# Patient Record
Sex: Male | Born: 1965 | Race: White | Hispanic: No | Marital: Married | Smoking: Never smoker
Health system: Southern US, Community
[De-identification: ages and names within clinical notes are randomized; demographics above are authoritative.]

## PROBLEM LIST (undated history)

## (undated) DIAGNOSIS — D239 Other benign neoplasm of skin, unspecified: Secondary | ICD-10-CM

## (undated) DIAGNOSIS — E785 Hyperlipidemia, unspecified: Secondary | ICD-10-CM

## (undated) DIAGNOSIS — M25511 Pain in right shoulder: Secondary | ICD-10-CM

## (undated) DIAGNOSIS — F419 Anxiety disorder, unspecified: Secondary | ICD-10-CM

## (undated) DIAGNOSIS — E669 Obesity, unspecified: Secondary | ICD-10-CM

## (undated) DIAGNOSIS — R400 Somnolence: Secondary | ICD-10-CM

## (undated) DIAGNOSIS — R1013 Epigastric pain: Secondary | ICD-10-CM

## (undated) DIAGNOSIS — E739 Lactose intolerance, unspecified: Secondary | ICD-10-CM

## (undated) DIAGNOSIS — R739 Hyperglycemia, unspecified: Secondary | ICD-10-CM

## (undated) DIAGNOSIS — K219 Gastro-esophageal reflux disease without esophagitis: Secondary | ICD-10-CM

## (undated) DIAGNOSIS — G4733 Obstructive sleep apnea (adult) (pediatric): Secondary | ICD-10-CM

## (undated) DIAGNOSIS — R0789 Other chest pain: Secondary | ICD-10-CM

## (undated) HISTORY — DX: Anxiety disorder, unspecified: F41.9

## (undated) HISTORY — DX: Pain in right shoulder: M25.511

## (undated) HISTORY — DX: Gastro-esophageal reflux disease without esophagitis: K21.9

## (undated) HISTORY — DX: Other chest pain: R07.89

## (undated) HISTORY — DX: Obstructive sleep apnea (adult) (pediatric): G47.33

## (undated) HISTORY — DX: Obesity, unspecified: E66.9

## (undated) HISTORY — DX: Other benign neoplasm of skin, unspecified: D23.9

## (undated) HISTORY — DX: Somnolence: R40.0

## (undated) HISTORY — DX: Hyperglycemia, unspecified: R73.9

## (undated) HISTORY — PX: TONSILLECTOMY AND ADENOIDECTOMY: SUR1326

## (undated) HISTORY — DX: Hyperlipidemia, unspecified: E78.5

## (undated) HISTORY — DX: Epigastric pain: R10.13

## (undated) HISTORY — DX: Lactose intolerance, unspecified: E73.9

## (undated) HISTORY — PX: COLONOSCOPY: SHX174

---

## 2009-11-09 ENCOUNTER — Encounter (INDEPENDENT_AMBULATORY_CARE_PROVIDER_SITE_OTHER): Payer: Self-pay | Admitting: *Deleted

## 2010-11-13 NOTE — Letter (Signed)
Summary: New Patient letter  [facility]  [address] [place], [place] [address]   Phone: [phone]  Fax: [phone]       11/09/2009 MRN: [id number]  BRIANA NEWMAN [address] [place], [place]  [address]  Dear Mr. Fiser,  Welcome to the Gastroenterology Division at [facility].    You are scheduled to see Dr.  Marina Goodell on 12-11-09 at 2:45pm on the 3rd floor at [facility], [address]  We ask that you try to arrive at our office 15 minutes prior to your appointment time to allow for check-in.  We would like you to complete the enclosed self-administered evaluation form prior to your visit and bring it with you on the day of your appointment.  We will review it with you.  Also, please bring a complete list of all your medications or, if you prefer, bring the medication bottles and we will list them.  Please bring your insurance card so that we may make a copy of it.  If your insurance requires a referral to see a specialist, please bring your referral form from your primary care physician.  Co-payments are due at the time of your visit and may be paid by cash, check or credit card.     Your office visit will consist of a consult with your physician (includes a physical exam), any laboratory testing he/she may order, scheduling of any necessary diagnostic testing (e.g. x-ray, ultrasound, CT-scan), and scheduling of a procedure (e.g. Endoscopy, Colonoscopy) if required.  Please allow enough time on your schedule to allow for any/all of these possibilities.    If you cannot keep your appointment, please call [phone] to cancel or reschedule prior to your appointment date.  This allows [place] the opportunity to schedule an appointment for another patient in need of care.  If you do not cancel or reschedule by 5 p.m. the business day prior to your appointment date, you will be charged a $50.00 late cancellation/no-show fee.    Thank you for choosing [facility] for your medical needs.  We appreciate the opportunity to care for you.  Please visit [place] at our website  to learn more about our practice.                     Sincerely,                                                             The Gastroenterology Division

## 2015-08-14 ENCOUNTER — Encounter: Payer: Self-pay | Admitting: Neurology

## 2015-08-14 ENCOUNTER — Ambulatory Visit (INDEPENDENT_AMBULATORY_CARE_PROVIDER_SITE_OTHER): Payer: BC Managed Care – PPO | Admitting: Neurology

## 2015-08-14 VITALS — BP 146/92 | HR 78 | Resp 16 | Ht 66.0 in | Wt 215.0 lb

## 2015-08-14 DIAGNOSIS — R351 Nocturia: Secondary | ICD-10-CM | POA: Diagnosis not present

## 2015-08-14 DIAGNOSIS — G4761 Periodic limb movement disorder: Secondary | ICD-10-CM | POA: Diagnosis not present

## 2015-08-14 DIAGNOSIS — R0683 Snoring: Secondary | ICD-10-CM | POA: Diagnosis not present

## 2015-08-14 DIAGNOSIS — G4719 Other hypersomnia: Secondary | ICD-10-CM

## 2015-08-14 DIAGNOSIS — G2581 Restless legs syndrome: Secondary | ICD-10-CM | POA: Diagnosis not present

## 2015-08-14 NOTE — Progress Notes (Signed)
Subjective:    Patient ID: Chase Drake is a 49 y.o. male.  HPI     [name], MD, PhD [facility] [address] [place], [place] [address]  Dear Dr. Virgina Jock,   I saw your patient, Chase Drake, upon your kind request in my neurologic clinic today for initial consultation of his sleep disorder, in particular concern for underlying obstructive sleep apnea. The patient is unaccompanied today. As you know, Chase Drake is a 49 year old right-handed gentleman with an underlying medical history of anxiety, hyperlipidemia, allergic rhinitis, atypical chest pain, Crohn's disease, eosinophilic esophagitis, restless leg syndrome, and obesity, who reports snoring and excessive daytime somnolence, problems going to sleep, and nonrestorative sleep. I reviewed your office note from 07/20/2015, which you kindly included. His Epworth sleepiness score is 13 out of 24 today, his fatigue score is 41 out of 63. He reports multiple nighttime awakenings. He has nocturia, on average 2-3 times per night. He works full-time as a Programmer, multimedia. His bedtime is around 9 PM. His rise time is around 5:30 AM. He does not wake up rested and usually feels groggy. He has occasional nightmares and vivid dreams. He has had vivid dreams for long time. He has been on Paxil for about 3 years. His weight has been stable. He is trying to lose weight. He does not drink alcohol or smoke. He drinks caffeine in the form of coffee, usually one per day. He has no TV in the bedroom. He has a dog in the bedroom at times but not in bed. He lives with his wife. He has 2 grown children. He has problems initiating and maintaining sleep at times. He does not take any medication for this. In the past, he took Mirapex for restless legs which was helpful. He no longer takes it. He ran out of the prescription a year ago or so. He has no family history of OSA. He had a tonsillectomy as a child. He  has restless leg symptoms, sometimes in the middle of the night he has difficulty going back to sleep in his legs hurt and sometimes he has a crawling sensation in his legs. His wife endorses that he twitches his legs and his sleep. His snoring can be loud. He has had pauses in his breathing per wife's report. He denies morning headaches.  His Past Medical History Is Significant For: Past Medical History  Diagnosis Date  . Anxiety   . Hyperlipidemia   . Obesity   . Dysplastic nevus   . Dyspepsia   . Atypical chest pain   . Somnolence   . OSA (obstructive sleep apnea)   . Crohn's disease ([place])   . Lactose intolerance   . Right shoulder pain   . Hyperglycemia   . GERD (gastroesophageal reflux disease)     His Past Surgical History Is Significant For: Past Surgical History  Procedure Laterality Date  . Tonsillectomy and adenoidectomy      His Family History Is Significant For: Family History  Problem Relation Age of Onset  . Breast cancer Mother   . Benign prostatic hyperplasia Father   . Anxiety disorder Sister   . Asthma Brother     His Social History Is Significant For: Social History   Social History  . Marital Status: Married    Spouse Name: N/A  . Number of Children: 2  . Years of Education: BA   Occupational History  . [employer]  Social History Main Topics  . Smoking status: Never Smoker   . Smokeless tobacco: None  . Alcohol Use: 0.0 oz/week    0 Standard drinks or equivalent per week  . Drug Use: No  . Sexual Activity: Not Asked   Other Topics Concern  . None   Social History Narrative   Drinks 1 cup of coffee a day     His Allergies Are:  Allergies  Allergen Reactions  . Fish Allergy   . Poultry Meal   :   His Current Medications Are:  Outpatient Encounter Prescriptions as of 08/14/2015  Medication Sig  . omeprazole (PRILOSEC) 20 MG capsule Take 20 mg by mouth daily.  Marland Kitchen PARoxetine (PAXIL) 30 MG tablet Take 30 mg by mouth  daily.   No facility-administered encounter medications on file as of 08/14/2015.  :  Review of Systems:  Out of a complete 14 point review of systems, all are reviewed and negative with the exception of these symptoms as listed below:   Review of Systems  Constitutional: Positive for fatigue.  Respiratory: Positive for cough.   Musculoskeletal:       Aching muscles   Allergic/Immunologic: Positive for food allergies.  Neurological: Positive for headaches.       Trouble falling and staying asleep, snoring, restless legs, witnessed apnea, wakes up feeling tired in the morning, daytime tiredness, denies taking naps   Psychiatric/Behavioral:       Anxiety, decreased energy   Epworth Sleepiness Scale 0= would never doze 1= slight chance of dozing 2= moderate chance of dozing 3= high chance of dozing  Sitting and reading:1 Watching TV:2 Sitting inactive in a public place (ex. Theater or meeting):1 As a passenger in a car for an hour without a break:2 Lying down to rest in the afternoon:3 Sitting and talking to someone:1 Sitting quietly after lunch (no alcohol):2 In a car, while stopped in traffic:1 Total:13  Objective:  Neurologic Exam  Physical Exam Physical Examination:   Filed Vitals:   08/14/15 1559  BP: 146/92  Pulse: 78  Resp: 16    General Examination: The patient is a very pleasant 49 y.o. male in no acute distress. He appears well-developed and well-nourished and well groomed.   HEENT: Normocephalic, atraumatic, pupils are equal, round and reactive to light and accommodation. Funduscopic exam is normal with sharp disc margins noted. Extraocular tracking is good without limitation to gaze excursion or nystagmus noted. Normal smooth pursuit is noted. Hearing is grossly intact. Tympanic membranes are clear bilaterally. Face is symmetric with normal facial animation and normal facial sensation. Speech is clear with no dysarthria noted. There is no hypophonia. There is  no lip, neck/head, jaw or voice tremor. Neck is supple with full range of passive and active motion. There are no carotid bruits on auscultation. Oropharynx exam reveals: mild mouth dryness, adequate dental hygiene and moderate airway crowding, due to  redundant soft palate and elongated uvula. Tongue is thicker. Mallampati is class II. Tonsils are absent. Neck circumference is 17-7/8 inches. He has a Mild overbite. Nasal inspection reveals no significant nasal mucosal bogginess or redness and no septal deviation.   Chest: Clear to auscultation without wheezing, rhonchi or crackles noted.  Heart: S1+S2+0, regular and normal without murmurs, rubs or gallops noted.   Abdomen: Soft, non-tender and non-distended with normal bowel sounds appreciated on auscultation.  Extremities: There is no pitting edema in the distal lower extremities bilaterally. Pedal pulses are intact.  Skin: Warm and dry without trophic  changes noted. There are no varicose veins.  Musculoskeletal: exam reveals no obvious joint deformities, tenderness or joint swelling or erythema.   Neurologically:  Mental status: The patient is awake, alert and oriented in all 4 spheres. His immediate and remote memory, attention, language skills and fund of knowledge are appropriate. There is no evidence of aphasia, agnosia, apraxia or anomia. Speech is clear with normal prosody and enunciation. Thought process is linear. Mood is normal and affect is normal.  Cranial nerves II - XII are as described above under HEENT exam. In addition: shoulder shrug is normal with equal shoulder height noted. Motor exam: Normal bulk, strength and tone is noted. There is no drift, tremor or rebound. Romberg is negative. Reflexes are 2+ throughout. Babinski: Toes are flexor bilaterally. Fine motor skills and coordination: intact with normal finger taps, normal hand movements, normal rapid alternating patting, normal foot taps and normal foot agility.  Cerebellar  testing: No dysmetria or intention tremor on finger to nose testing. Heel to shin is unremarkable bilaterally. There is no truncal or gait ataxia.  Sensory exam: intact to light touch, pinprick, vibration, temperature sense in the upper and lower extremities.  Gait, station and balance: He stands easily. No veering to one side is noted. No leaning to one side is noted. Posture is age-appropriate and stance is narrow based. Gait shows normal stride length and normal pace. No problems turning are noted. He turns en bloc. Tandem walk is unremarkable.   Assessment and Plan:  In summary, Chase Drake is a very pleasant 49 y.o.-year old male with an underlying medical history of anxiety, hyperlipidemia, allergic rhinitis, atypical chest pain, Crohn's disease, eosinophilic esophagitis, restless leg syndrome, and obesity, who reports snoring and excessive daytime somnolence, problems going to sleep, and nonrestorative sleep, nocturia and multiple nighttime awakenings. His history and physical exam are indeed concerning for obstructive sleep apnea (OSA). in addition, he complains of restless leg symptoms and leg twitching in his sleep, in keeping with PLMD. I had a long chat with the patient about my findings and the diagnosis of OSA, its prognosis and treatment options. We talked about medical treatments, surgical interventions and non-pharmacological approaches. I explained in particular the risks and ramifications of untreated moderate to severe OSA, especially with respect to developing cardiovascular disease down the Road, including congestive heart failure, difficult to treat hypertension, cardiac arrhythmias, or stroke. Even type 2 diabetes has, in part, been linked to untreated OSA. Symptoms of untreated OSA include daytime sleepiness, memory problems, mood irritability and mood disorder such as depression and anxiety, lack of energy, as well as recurrent headaches, especially morning headaches. We talked  about trying to maintain a healthy lifestyle in general, as well as the importance of weight control. I encouraged the patient to eat healthy, exercise daily and keep well hydrated, to keep a scheduled bedtime and wake time routine, to not skip any meals and eat healthy snacks in between meals. I advised the patient not to drive when feeling sleepy. I recommended the following at this time: sleep study with potential positive airway pressure titration. (We will score hypopneas at 3% and split the sleep study into diagnostic and treatment portion, if the estimated. 2 hour AHI is >15/h).   I explained the sleep test procedure to the patient and also outlined possible surgical and non-surgical treatment options of OSA, including the use of a custom-made dental device (which would require a referral to a specialist dentist or oral surgeon), upper airway surgical  options, such as pillar implants, radiofrequency surgery, tongue base surgery, and UPPP (which would involve a referral to an ENT surgeon). Rarely, jaw surgery such as mandibular advancement may be considered.  I also explained the CPAP treatment option to the patient, who indicated that he would be willing to try CPAP if the need arises. I explained the importance of being compliant with PAP treatment, not only for insurance purposes but primarily to improve His symptoms, and for the patient's long term health benefit, including to reduce His cardiovascular risks. I answered all his questions today and the patient was in agreement. I would like to see him back after the sleep study is completed and encouraged him to call with any interim questions, concerns, problems or updates.   Thank you very much for allowing me to participate in the care of this nice patient. If I can be of any further assistance to you please do not hesitate to call me at [phone].  Sincerely,   [name], MD, PhD

## 2015-08-14 NOTE — Patient Instructions (Addendum)
Based on your symptoms and your exam I believe you are at risk for obstructive sleep apnea or OSA, and I think we should proceed with a sleep study to determine whether you do or do not have OSA and how severe it is. If you have more than mild OSA, I want you to consider treatment with CPAP. Please remember, the risks and ramifications of moderate to severe obstructive sleep apnea or OSA are: Cardiovascular disease, including congestive heart failure, stroke, difficult to control hypertension, arrhythmias, and even type 2 diabetes has been linked to untreated OSA. Sleep apnea causes disruption of sleep and sleep deprivation in most cases, which, in turn, can cause recurrent headaches, problems with memory, mood, concentration, focus, and vigilance. Most people with untreated sleep apnea report excessive daytime sleepiness, which can affect their ability to drive. Please do not drive if you feel sleepy.   I will likely see you back after your sleep study to go over the test results and where to go from there. We will call you after your sleep study to advise about the results (most likely, you will hear from Beverlee Nims, my nurse) and to set up an appointment at the time, as necessary.    Our sleep lab administrative assistant, Arrie Aran will meet with you or call you to schedule your sleep study. If you don't hear back from her by next week please feel free to call her at [phone]. This is her direct line and please leave a message with your phone number to call back if you get the voicemail box. She will call back as soon as possible.   We will be on the lookout for leg twitching in your sleep as well and we will be in touch over the phone after the study is done.

## 2015-09-04 ENCOUNTER — Ambulatory Visit (INDEPENDENT_AMBULATORY_CARE_PROVIDER_SITE_OTHER): Payer: BC Managed Care – PPO | Admitting: Neurology

## 2015-09-04 DIAGNOSIS — G4731 Primary central sleep apnea: Secondary | ICD-10-CM

## 2015-09-04 DIAGNOSIS — G4733 Obstructive sleep apnea (adult) (pediatric): Secondary | ICD-10-CM | POA: Diagnosis not present

## 2015-09-04 DIAGNOSIS — G479 Sleep disorder, unspecified: Secondary | ICD-10-CM

## 2015-09-04 DIAGNOSIS — G4761 Periodic limb movement disorder: Secondary | ICD-10-CM

## 2015-09-04 NOTE — Sleep Study (Signed)
Please see the scanned sleep study interpretation located in the Procedure tab within the Chart Review section. 

## 2015-09-05 ENCOUNTER — Telehealth: Payer: Self-pay | Admitting: Neurology

## 2015-09-05 DIAGNOSIS — G4733 Obstructive sleep apnea (adult) (pediatric): Secondary | ICD-10-CM

## 2015-09-05 NOTE — Telephone Encounter (Signed)
I spoke to patient and he is aware of results and recommendations. He would like to proceed with treatment. I will refer to [facility]. Pt will call back to make f/u appt. I will send him a letter reminding him to make appt and stress importance with compliance. I will send report to PCP>

## 2015-09-05 NOTE — Telephone Encounter (Signed)
Diana:  Patient referred by Dr. Virgina Jock, seen by me on 08/14/15, split study on 09/04/15, Ins: [id number]. Please call and notify patient that the recent sleep study confirmed the diagnosis of severe OSA. He did well with CPAP during the study with significant improvement of the respiratory events. Therefore, I would like start the patient on CPAP therapy at home by prescribing a machine for home use. I placed the order in the chart. The patient will need a follow up appointment with me in 8 to 10 weeks post set up that has to be scheduled; please go ahead and schedule while you have the patient on the phone and make sure patient understands the importance of keeping this window for the FU appointment, as it is often an insurance requirement and failing to adhere to this may result in losing coverage for sleep apnea treatment.  Please re-enforce the importance of compliance with treatment and the need for [place] to monitor compliance data - again an insurance requirement and good feedback for the patient as far as how they are doing.  Also remind patient, that any upcoming CPAP machine or mask issues, should be first addressed with the DME company. Please ask if patient has a preference regarding DME company.  Please arrange for CPAP set up at home through a DME company of patient's choice - once you have spoken to the patient - and faxed/routed report to PCP and referring MD (if other than PCP), you can close this encounter, thanks,   [name], MD, PhD [facility] ([place])

## 2015-11-02 ENCOUNTER — Telehealth: Payer: Self-pay

## 2015-11-02 NOTE — Telephone Encounter (Signed)
I spoke to Chase Drake and reminded him to bring CPAP or memory card from CPAP machine to appt. Pt also needed to reschedule for the 31st.

## 2015-11-06 ENCOUNTER — Ambulatory Visit: Payer: BC Managed Care – PPO | Admitting: Neurology

## 2015-11-14 ENCOUNTER — Ambulatory Visit: Payer: BC Managed Care – PPO | Admitting: Neurology

## 2015-11-14 ENCOUNTER — Telehealth: Payer: Self-pay

## 2015-11-14 NOTE — Telephone Encounter (Signed)
Patient did not show to appt.  

## 2015-12-05 ENCOUNTER — Ambulatory Visit (INDEPENDENT_AMBULATORY_CARE_PROVIDER_SITE_OTHER): Payer: BC Managed Care – PPO | Admitting: Neurology

## 2015-12-05 ENCOUNTER — Encounter: Payer: Self-pay | Admitting: Neurology

## 2015-12-05 VITALS — BP 158/88 | HR 78 | Resp 18 | Ht 66.0 in | Wt 220.0 lb

## 2015-12-05 DIAGNOSIS — G4733 Obstructive sleep apnea (adult) (pediatric): Secondary | ICD-10-CM | POA: Diagnosis not present

## 2015-12-05 DIAGNOSIS — E669 Obesity, unspecified: Secondary | ICD-10-CM | POA: Diagnosis not present

## 2015-12-05 DIAGNOSIS — Z9989 Dependence on other enabling machines and devices: Principal | ICD-10-CM

## 2015-12-05 NOTE — Progress Notes (Signed)
Subjective:    Patient ID: Chase Drake is a 50 y.o. male.  HPI     Interim history:   Chase Drake is a 50 year old right-handed gentleman with an underlying medical history of anxiety, hyperlipidemia, allergic rhinitis, atypical chest pain, Crohn's disease, eosinophilic esophagitis, restless leg syndrome, and obesity, who presents for follow-up consultation of his obstructive sleep apnea, after his recent sleep study. Of note, the patient no showed for an appointment on 11/14/2015. The patient is unaccompanied today. I first met him on 08/14/2015 at the request of his primary care physician, at which time the patient reported snoring, nonrestorative sleep, difficulty initiating sleep and excessive daytime somnolence. I invited him back for sleep study. He had a split-night sleep study on 09/04/2015. I went over his test results with him in detail today. Baseline sleep efficiency was 70.6% with a sleep latency of 11 minutes and wake after sleep onset of 48.5 minutes with severe sleep fragmentation noted. He had an elevated arousal index. He had absence of slow-wave sleep and REM sleep was 19.6% with a mildly prolonged REM latency. He had no significant EKG or EEG changes. He had moderate PLMS with an index of 39 per hour, resulting in 6.3 arousals per hour. Moderate to loud snoring was noted, AHI was 55.8 per hour. Average oxygen saturation was 94%, nadir was 68% in REM sleep. He was then titrated on CPAP. Sleep efficiency was 84.5%, latency to sleep 3.5 minutes and wake after sleep onset was 40 minutes with mild to moderate sleep fragmentation noted. The arousal index was normal. He had absence of slow-wave sleep and REM sleep was increased at 34.2%. Average oxygen saturation was 96%, nadir was 89%. He had no significant PLMS or EKG or EEG changes during the second part of the study. CPAP was titrated from 5 cm to 14 cm. AHI was 0 per hour on the final pressure with supine REM sleep achieved. Based on  his test results are prescribed CPAP therapy for home use of a pressure of 14 cm he had nasal pillows.  Today, 12/05/2015: I reviewed his CPAP compliance data from 11/05/2015 through 12/04/2015 which is a total of 30 days during which time he used his machine every night with percent used days greater than 4 hours at 93.3%, indicating excellent compliance with an average usage of 6 hours and 51 minutes, residual AHI low at 0.9 per hour, leaked low, pressure at 14 cm.  Today, 10/03/2016: He reports doing well. He feels like he is resting better. He is improved in his daytime somnolence and sleep quality. He had a viral cold recently including symptoms of bronchitis and did not use his machine for about 2 days. He still have some residual nasal congestion and has tried [employer] with some success. He denies any fever or headache. His weight increased by 5 pounds in the last 3 months after the holidays. He stopped taking his Paxil a few months ago. He did not feel it was helpful.  Previously:  08/14/2015: He reports snoring and excessive daytime somnolence, problems going to sleep, and nonrestorative sleep. I reviewed your office note from 07/20/2015, which you kindly included. His Epworth sleepiness score is 13 out of 24 today, his fatigue score is 41 out of 63. He reports multiple nighttime awakenings. He has nocturia, on average 2-3 times per night. He works full-time as a Programmer, multimedia. His bedtime is around 9 PM. His rise time is around 5:30 AM. He does not  wake up rested and usually feels groggy. He has occasional nightmares and vivid dreams. He has had vivid dreams for long time. He has been on Paxil for about 3 years. His weight has been stable. He is trying to lose weight. He does not drink alcohol or smoke. He drinks caffeine in the form of coffee, usually one per day. He has no TV in the bedroom. He has a dog in the bedroom at times but not in bed. He lives with his wife. He has 2 grown  children. He has problems initiating and maintaining sleep at times. He does not take any medication for this. In the past, he took Mirapex for restless legs which was helpful. He no longer takes it. He ran out of the prescription a year ago or so. He has no family history of OSA. He had a tonsillectomy as a child. He has restless leg symptoms, sometimes in the middle of the night he has difficulty going back to sleep in his legs hurt and sometimes he has a crawling sensation in his legs. His wife endorses that he twitches his legs and his sleep. His snoring can be loud. He has had pauses in his breathing per wife's report. He denies morning headaches.  His Past Medical History Is Significant For: Past Medical History  Diagnosis Date  . Anxiety   . Hyperlipidemia   . Obesity   . Dysplastic nevus   . Dyspepsia   . Atypical chest pain   . Somnolence   . OSA (obstructive sleep apnea)   . Crohn's disease ([place])   . Lactose intolerance   . Right shoulder pain   . Hyperglycemia   . GERD (gastroesophageal reflux disease)     His Past Surgical History Is Significant For: Past Surgical History  Procedure Laterality Date  . Tonsillectomy and adenoidectomy      His Family History Is Significant For: Family History  Problem Relation Age of Onset  . Breast cancer Mother   . Benign prostatic hyperplasia Father   . Anxiety disorder Sister   . Asthma Brother     His Social History Is Significant For: Social History   Social History  . Marital Status: Married    Spouse Name: N/A  . Number of Children: 2  . Years of Education: BA   Occupational History  . [employer] History Main Topics  . Smoking status: Never Smoker   . Smokeless tobacco: None  . Alcohol Use: 0.0 oz/week    0 Standard drinks or equivalent per week  . Drug Use: No  . Sexual Activity: Not Asked   Other Topics Concern  . None   Social History Narrative   Drinks 1 cup of coffee a day     His  Allergies Are:  Allergies  Allergen Reactions  . Fish Allergy   . Poultry Meal   :   His Current Medications Are:  Outpatient Encounter Prescriptions as of 12/05/2015  Medication Sig  . omeprazole (PRILOSEC) 20 MG capsule Take 20 mg by mouth daily.  Marland Kitchen PARoxetine (PAXIL) 30 MG tablet Take 30 mg by mouth daily.   No facility-administered encounter medications on file as of 12/05/2015.  :  Review of Systems:  Out of a complete 14 point review of systems, all are reviewed and negative with the exception of these symptoms as listed below:   Review of Systems  Neurological:       Patient feels like he is  doing well on CPAP. Has not used for a couple days recently due to respiratory virus.     Objective:  Neurologic Exam  Physical Exam Physical Examination:   Filed Vitals:   12/05/15 1511  BP: 158/88  Pulse: 78  Resp: 18   General Examination: The patient is a very pleasant 50 y.o. male in no acute distress. He appears well-developed and well-nourished and well groomed. He is in good spirits today.  HEENT: Normocephalic, atraumatic, pupils are equal, round and reactive to light and accommodation. Extraocular tracking is good without limitation to gaze excursion or nystagmus noted. Normal smooth pursuit is noted. Hearing is grossly intact. Face is symmetric with normal facial animation and normal facial sensation. Speech is clear with no dysarthria noted. There is no hypophonia. There is no lip, neck/head, jaw or voice tremor. Neck is supple with full range of passive and active motion. There are no carotid bruits on auscultation. Oropharynx exam reveals: mild mouth dryness, adequate dental hygiene and moderate airway crowding, due to  redundant soft palate and elongated uvula. Tongue is thicker. Mallampati is class II. Tonsils are absent. He has a Mild overbite. Nasal inspection reveals no significant nasal mucosal bogginess or redness and no septal deviation.   Chest: Clear to  auscultation without wheezing, rhonchi or crackles noted.  Heart: S1+S2+0, regular and normal without murmurs, rubs or gallops noted.   Abdomen: Soft, non-tender and non-distended with normal bowel sounds appreciated on auscultation.  Extremities: There is no pitting edema in the distal lower extremities bilaterally. Pedal pulses are intact.  Skin: Warm and dry without trophic changes noted. There are no varicose veins.  Musculoskeletal: exam reveals no obvious joint deformities, tenderness or joint swelling or erythema.   Neurologically:  Mental status: The patient is awake, alert and oriented in all 4 spheres. His immediate and remote memory, attention, language skills and fund of knowledge are appropriate. There is no evidence of aphasia, agnosia, apraxia or anomia. Speech is clear with normal prosody and enunciation. Thought process is linear. Mood is normal and affect is normal.  Cranial nerves II - XII are as described above under HEENT exam. In addition: shoulder shrug is normal with equal shoulder height noted. Motor exam: Normal bulk, strength and tone is noted. There is no drift, tremor or rebound. Romberg is negative. Reflexes are 2+ throughout. Fine motor skills and coordination: intact with normal finger taps, normal hand movements, normal rapid alternating patting, normal foot taps and normal foot agility.  Cerebellar testing: No dysmetria or intention tremor on finger to nose testing. Heel to shin is unremarkable bilaterally. There is no truncal or gait ataxia.  Sensory exam: intact to light touch in the upper and lower extremities.  Gait, station and balance: He stands easily. No veering to one side is noted. No leaning to one side is noted. Posture is age-appropriate and stance is narrow based. Gait shows normal stride length and normal pace. No problems turning are noted. He turns en bloc. Tandem walk is unremarkable.   Assessment and Plan:  In summary, Chase Drake is a  very pleasant 50 year old male with an underlying medical history of anxiety, hyperlipidemia, allergic rhinitis, atypical chest pain, Crohn's disease, eosinophilic esophagitis, restless leg syndrome, and obesity, who presents for follow-up consultation of his obstructive sleep apnea. He had a split-night sleep study in November 2016 and we talked about the test results in detail today. He has severe obstructive sleep apnea, well treated with CPAP at 14 cm  with excellent compliance and good results reported. He is commended on his treatment adherence and I'm pleased to hear that he feels better. He is advised to monitor his blood pressure. He is advised to use CPAP regularly and try to use nasal saline rinses to combat congestion. He is advised to stay well-hydrated, should he start having a headache or fevers, he will have to see his primary care physician for concern for sinusitis. His physical exam and neurological exam are stable. We talked again about the risks and ramifications of untreated severe OSA including cardiovascular disease, difficult to treat hypertension, cardiac arrhythmias, or stroke. Even type 2 diabetes has, in part, been linked to untreated OSA. Symptoms of untreated OSA include daytime sleepiness, memory problems, mood irritability and mood disorder such as depression and anxiety, lack of energy, as well as recurrent headaches, especially morning headaches. We talked about trying to maintain a healthy lifestyle in general, as well as the importance of weight control. I encouraged the patient to eat healthy, exercise daily and keep well hydrated, to keep a scheduled bedtime and wake time routine, to not skip any meals and eat healthy snacks in between meals. I advised the patient not to drive when feeling sleepy. I explained the importance of being compliant with PAP treatment, not only for insurance purposes but primarily to improve His symptoms, and for the patient's long term health  benefit, including to reduce His cardiovascular risks. I answered all his questions today and the patient was in agreement. I would like to see him back after the sleep study is completed and encouraged him to call with any interim questions, concerns, problems or updates.

## 2015-12-05 NOTE — Patient Instructions (Addendum)

## 2016-04-15 ENCOUNTER — Other Ambulatory Visit: Payer: Self-pay | Admitting: Orthopedic Surgery

## 2016-04-15 NOTE — H&P (Signed)
     PREOPERATIVE H&P  Chief Complaint: right arm pain, right arm weakness  HPI: Chase Drake is a 50 y.o. male who presents with ongoing pain and weakness in the right arm.  The patient has had symptoms now for approximately 2 months.  MRI reveals right-sided neuroforaminal stenosis at C5-C6 and C6-C7, correlating to the patient's symptoms  Patient has failed multiple forms of conservative care and continues to have pain (see office notes for additional details regarding the patient's full course of treatment)  Past Medical History  Diagnosis Date  . Anxiety   . Hyperlipidemia   . Obesity   . Dysplastic nevus   . Dyspepsia   . Atypical chest pain   . Somnolence   . OSA (obstructive sleep apnea)   . Crohn's disease ([place])   . Lactose intolerance   . Right shoulder pain   . Hyperglycemia   . GERD (gastroesophageal reflux disease)    Past Surgical History  Procedure Laterality Date  . Tonsillectomy and adenoidectomy     Social History   Social History  . Marital Status: Married    Spouse Name: N/A  . Number of Children: 2  . Years of Education: BA   Occupational History  . [place] History Main Topics  . Smoking status: Never Smoker   . Smokeless tobacco: Not on file  . Alcohol Use: 0.0 oz/week    0 Standard drinks or equivalent per week  . Drug Use: No  . Sexual Activity: Not on file   Other Topics Concern  . Not on file   Social History Narrative   Drinks 1 cup of coffee a day    Family History  Problem Relation Age of Onset  . Breast cancer Mother   . Benign prostatic hyperplasia Father   . Anxiety disorder Sister   . Asthma Brother    Allergies  Allergen Reactions  . Fish Allergy   . Poultry Meal    Prior to Admission medications   Medication Sig Start Date End Date Taking? Authorizing Provider  omeprazole (PRILOSEC) 20 MG capsule Take 20 mg by mouth daily.    Historical Provider, MD  PARoxetine (PAXIL) 30 MG tablet  Take 30 mg by mouth daily.    Historical Provider, MD     All other systems have been reviewed and were otherwise negative with the exception of those mentioned in the HPI and as above.  Physical Exam: There were no vitals filed for this visit.  General: Alert, no acute distress Cardiovascular: No pedal edema Respiratory: No cyanosis, no use of accessory musculature Skin: No lesions in the area of chief complaint Neurologic: Sensation intact distally Psychiatric: Patient is competent for consent with normal mood and affect Lymphatic: No axillary or cervical lymphadenopathy  MUSCULOSKELETAL: trace weakness is noted to right hand intrinsics as well as right finger flexion and triceps on the right  Assessment/Plan: Right arm pain  Plan for Procedure(s): ANTERIOR CERVICAL DECOMPRESSION FUSION CERVICAL 5-6, CERVICAL 6-7 WITH INSTRUMENTATION AND ALLOGRAFT   Sinclair Ship, MD 04/15/2016 2:55 PM

## 2016-04-17 ENCOUNTER — Encounter (HOSPITAL_COMMUNITY): Payer: Self-pay | Admitting: *Deleted

## 2016-04-17 MED ORDER — CEFAZOLIN SODIUM-DEXTROSE 2-4 GM/100ML-% IV SOLN
2.0000 g | INTRAVENOUS | Status: AC
  Administered 2016-04-18: 2 g via INTRAVENOUS
  Filled 2016-04-17: qty 100

## 2016-04-17 NOTE — Progress Notes (Signed)
Pt denies cardiac history, chest pain or sob. 

## 2016-04-18 ENCOUNTER — Ambulatory Visit (HOSPITAL_COMMUNITY): Payer: BC Managed Care – PPO

## 2016-04-18 ENCOUNTER — Encounter (HOSPITAL_COMMUNITY)
Admission: RE | Disposition: A | Discharge status: Home / Self Care | Payer: Self-pay | Source: Ambulatory Visit | Attending: Orthopedic Surgery

## 2016-04-18 ENCOUNTER — Ambulatory Visit (HOSPITAL_COMMUNITY): Payer: BC Managed Care – PPO | Admitting: Anesthesiology

## 2016-04-18 ENCOUNTER — Observation Stay (HOSPITAL_COMMUNITY)
Admission: RE | Admit: 2016-04-18 | Discharge: 2016-04-19 | Disposition: A | Discharge status: Home / Self Care | Payer: BC Managed Care – PPO | Source: Ambulatory Visit | Attending: Orthopedic Surgery | Admitting: Orthopedic Surgery

## 2016-04-18 DIAGNOSIS — M4802 Spinal stenosis, cervical region: Secondary | ICD-10-CM | POA: Diagnosis not present

## 2016-04-18 DIAGNOSIS — Z419 Encounter for procedure for purposes other than remedying health state, unspecified: Secondary | ICD-10-CM

## 2016-04-18 DIAGNOSIS — M50122 Cervical disc disorder at C5-C6 level with radiculopathy: Secondary | ICD-10-CM | POA: Diagnosis not present

## 2016-04-18 DIAGNOSIS — K5090 Crohn's disease, unspecified, without complications: Secondary | ICD-10-CM | POA: Diagnosis not present

## 2016-04-18 DIAGNOSIS — E739 Lactose intolerance, unspecified: Secondary | ICD-10-CM | POA: Insufficient documentation

## 2016-04-18 DIAGNOSIS — K219 Gastro-esophageal reflux disease without esophagitis: Secondary | ICD-10-CM | POA: Diagnosis not present

## 2016-04-18 DIAGNOSIS — Z6837 Body mass index (BMI) 37.0-37.9, adult: Secondary | ICD-10-CM | POA: Insufficient documentation

## 2016-04-18 DIAGNOSIS — Z91018 Allergy to other foods: Secondary | ICD-10-CM | POA: Insufficient documentation

## 2016-04-18 DIAGNOSIS — F419 Anxiety disorder, unspecified: Secondary | ICD-10-CM | POA: Insufficient documentation

## 2016-04-18 DIAGNOSIS — Z01818 Encounter for other preprocedural examination: Secondary | ICD-10-CM

## 2016-04-18 DIAGNOSIS — M50123 Cervical disc disorder at C6-C7 level with radiculopathy: Secondary | ICD-10-CM | POA: Insufficient documentation

## 2016-04-18 DIAGNOSIS — E785 Hyperlipidemia, unspecified: Secondary | ICD-10-CM | POA: Diagnosis not present

## 2016-04-18 DIAGNOSIS — Z91013 Allergy to seafood: Secondary | ICD-10-CM | POA: Diagnosis not present

## 2016-04-18 DIAGNOSIS — M5410 Radiculopathy, site unspecified: Secondary | ICD-10-CM | POA: Diagnosis present

## 2016-04-18 DIAGNOSIS — G4733 Obstructive sleep apnea (adult) (pediatric): Secondary | ICD-10-CM | POA: Insufficient documentation

## 2016-04-18 HISTORY — PX: ANTERIOR CERVICAL DECOMP/DISCECTOMY FUSION: SHX1161

## 2016-04-18 LAB — CBC WITH DIFFERENTIAL/PLATELET
Basophils Absolute: 0 10*3/uL (ref 0.0–0.1)
Basophils Relative: 1 %
EOS ABS: 0.3 10*3/uL (ref 0.0–0.7)
EOS PCT: 5 %
HCT: 45.3 % (ref 39.0–52.0)
Hemoglobin: 15.4 g/dL (ref 13.0–17.0)
LYMPHS ABS: 1.6 10*3/uL (ref 0.7–4.0)
LYMPHS PCT: 28 %
MCH: 30.9 pg (ref 26.0–34.0)
MCHC: 34 g/dL (ref 30.0–36.0)
MCV: 91 fL (ref 78.0–100.0)
MONO ABS: 0.4 10*3/uL (ref 0.1–1.0)
Monocytes Relative: 7 %
Neutro Abs: 3.5 10*3/uL (ref 1.7–7.7)
Neutrophils Relative %: 59 %
PLATELETS: 218 10*3/uL (ref 150–400)
RBC: 4.98 MIL/uL (ref 4.22–5.81)
RDW: 13.5 % (ref 11.5–15.5)
WBC: 5.8 10*3/uL (ref 4.0–10.5)

## 2016-04-18 LAB — COMPREHENSIVE METABOLIC PANEL
ALT: 53 U/L (ref 17–63)
ANION GAP: 7 (ref 5–15)
AST: 33 U/L (ref 15–41)
Albumin: 3.6 g/dL (ref 3.5–5.0)
Alkaline Phosphatase: 61 U/L (ref 38–126)
BUN: 14 mg/dL (ref 6–20)
CHLORIDE: 109 mmol/L (ref 101–111)
CO2: 24 mmol/L (ref 22–32)
Calcium: 9.3 mg/dL (ref 8.9–10.3)
Creatinine, Ser: 0.86 mg/dL (ref 0.61–1.24)
Glucose, Bld: 108 mg/dL — ABNORMAL HIGH (ref 65–99)
POTASSIUM: 4.5 mmol/L (ref 3.5–5.1)
SODIUM: 140 mmol/L (ref 135–145)
Total Bilirubin: 0.8 mg/dL (ref 0.3–1.2)
Total Protein: 6.1 g/dL — ABNORMAL LOW (ref 6.5–8.1)

## 2016-04-18 LAB — URINALYSIS, ROUTINE W REFLEX MICROSCOPIC
Bilirubin Urine: NEGATIVE
GLUCOSE, UA: NEGATIVE mg/dL
Hgb urine dipstick: NEGATIVE
KETONES UR: NEGATIVE mg/dL
LEUKOCYTES UA: NEGATIVE
NITRITE: NEGATIVE
PROTEIN: NEGATIVE mg/dL
Specific Gravity, Urine: 1.022 (ref 1.005–1.030)
pH: 5.5 (ref 5.0–8.0)

## 2016-04-18 LAB — PROTIME-INR
INR: 1.12 (ref 0.00–1.49)
PROTHROMBIN TIME: 14.6 s (ref 11.6–15.2)

## 2016-04-18 LAB — APTT: APTT: 29 s (ref 24–37)

## 2016-04-18 SURGERY — ANTERIOR CERVICAL DECOMPRESSION/DISCECTOMY FUSION 2 LEVELS
Anesthesia: General | Site: Spine Cervical

## 2016-04-18 MED ORDER — BUPIVACAINE-EPINEPHRINE (PF) 0.25% -1:200000 IJ SOLN
INTRAMUSCULAR | Status: AC
  Filled 2016-04-18: qty 30

## 2016-04-18 MED ORDER — FENTANYL CITRATE (PF) 100 MCG/2ML IJ SOLN
INTRAMUSCULAR | Status: DC | PRN
  Administered 2016-04-18: 50 ug via INTRAVENOUS
  Administered 2016-04-18: 100 ug via INTRAVENOUS
  Administered 2016-04-18 (×3): 50 ug via INTRAVENOUS

## 2016-04-18 MED ORDER — HYDROMORPHONE HCL 1 MG/ML IJ SOLN
INTRAMUSCULAR | Status: AC
  Filled 2016-04-18: qty 1

## 2016-04-18 MED ORDER — LABETALOL HCL 5 MG/ML IV SOLN
5.0000 mg | INTRAVENOUS | Status: AC | PRN
  Administered 2016-04-18 (×3): 5 mg via INTRAVENOUS

## 2016-04-18 MED ORDER — SENNOSIDES-DOCUSATE SODIUM 8.6-50 MG PO TABS: 1.0000 | ORAL_TABLET | Freq: Every evening | ORAL | Status: DC | PRN

## 2016-04-18 MED ORDER — HYDRALAZINE HCL 20 MG/ML IJ SOLN
5.0000 mg | INTRAMUSCULAR | Status: AC
  Administered 2016-04-18 (×2): 5 mg via INTRAVENOUS

## 2016-04-18 MED ORDER — BISACODYL 5 MG PO TBEC: 5.0000 mg | DELAYED_RELEASE_TABLET | Freq: Every day | ORAL | Status: DC | PRN

## 2016-04-18 MED ORDER — MINERAL OIL LIGHT 100 % EX OIL
TOPICAL_OIL | CUTANEOUS | Status: AC
  Filled 2016-04-18: qty 25

## 2016-04-18 MED ORDER — ROCURONIUM BROMIDE 100 MG/10ML IV SOLN
INTRAVENOUS | Status: DC | PRN
  Administered 2016-04-18: 10 mg via INTRAVENOUS
  Administered 2016-04-18: 50 mg via INTRAVENOUS
  Administered 2016-04-18 (×2): 10 mg via INTRAVENOUS

## 2016-04-18 MED ORDER — ZOLPIDEM TARTRATE 5 MG PO TABS: 5.0000 mg | ORAL_TABLET | Freq: Every evening | ORAL | Status: DC | PRN

## 2016-04-18 MED ORDER — GELATIN ABSORBABLE 100 EX MISC
CUTANEOUS | Status: DC | PRN
  Administered 2016-04-18: 20 mL via TOPICAL

## 2016-04-18 MED ORDER — 0.9 % SODIUM CHLORIDE (POUR BTL) OPTIME
TOPICAL | Status: DC | PRN
  Administered 2016-04-18: 1000 mL

## 2016-04-18 MED ORDER — FENTANYL CITRATE (PF) 250 MCG/5ML IJ SOLN
INTRAMUSCULAR | Status: AC
  Filled 2016-04-18: qty 10

## 2016-04-18 MED ORDER — OXYCODONE HCL 5 MG/5ML PO SOLN: 5.0000 mg | Freq: Once | ORAL | Status: DC | PRN

## 2016-04-18 MED ORDER — MIDAZOLAM HCL 2 MG/2ML IJ SOLN
INTRAMUSCULAR | Status: AC
  Filled 2016-04-18: qty 2

## 2016-04-18 MED ORDER — LACTATED RINGERS IV SOLN
INTRAVENOUS | Status: DC | PRN
  Administered 2016-04-18 (×2): via INTRAVENOUS

## 2016-04-18 MED ORDER — ALUM & MAG HYDROXIDE-SIMETH 200-200-20 MG/5ML PO SUSP: 30.0000 mL | Freq: Four times a day (QID) | ORAL | Status: DC | PRN

## 2016-04-18 MED ORDER — LIDOCAINE HCL (CARDIAC) 20 MG/ML IV SOLN
INTRAVENOUS | Status: DC | PRN
  Administered 2016-04-18: 60 mg via INTRAVENOUS

## 2016-04-18 MED ORDER — SODIUM CHLORIDE 0.9% FLUSH: 3.0000 mL | INTRAVENOUS | Status: DC | PRN

## 2016-04-18 MED ORDER — PANTOPRAZOLE SODIUM 40 MG PO TBEC: 40.0000 mg | DELAYED_RELEASE_TABLET | Freq: Every day | ORAL | Status: DC

## 2016-04-18 MED ORDER — SUGAMMADEX SODIUM 500 MG/5ML IV SOLN
INTRAVENOUS | Status: DC | PRN
  Administered 2016-04-18: 350 mg via INTRAVENOUS

## 2016-04-18 MED ORDER — SUGAMMADEX SODIUM 200 MG/2ML IV SOLN
INTRAVENOUS | Status: AC
  Filled 2016-04-18: qty 2

## 2016-04-18 MED ORDER — LORATADINE 10 MG PO TABS
10.0000 mg | ORAL_TABLET | Freq: Every day | ORAL | Status: DC
  Administered 2016-04-18: 10 mg via ORAL
  Filled 2016-04-18: qty 1

## 2016-04-18 MED ORDER — MENTHOL 3 MG MT LOZG: 1.0000 | LOZENGE | OROMUCOSAL | Status: DC | PRN

## 2016-04-18 MED ORDER — DIAZEPAM 5 MG PO TABS
ORAL_TABLET | ORAL | Status: AC
  Filled 2016-04-18: qty 1

## 2016-04-18 MED ORDER — OXYCODONE-ACETAMINOPHEN 5-325 MG PO TABS
ORAL_TABLET | ORAL | Status: AC
  Filled 2016-04-18: qty 2

## 2016-04-18 MED ORDER — ONDANSETRON HCL 4 MG/2ML IJ SOLN
4.0000 mg | INTRAMUSCULAR | Status: DC | PRN
  Administered 2016-04-18: 4 mg via INTRAVENOUS
  Filled 2016-04-18: qty 2

## 2016-04-18 MED ORDER — MIDAZOLAM HCL 5 MG/5ML IJ SOLN
INTRAMUSCULAR | Status: DC | PRN
  Administered 2016-04-18: 2 mg via INTRAVENOUS

## 2016-04-18 MED ORDER — POVIDONE-IODINE 7.5 % EX SOLN
Freq: Once | CUTANEOUS | Status: DC
  Filled 2016-04-18: qty 118

## 2016-04-18 MED ORDER — ARTIFICIAL TEARS OP OINT
TOPICAL_OINTMENT | OPHTHALMIC | Status: DC | PRN
  Administered 2016-04-18: 1 via OPHTHALMIC

## 2016-04-18 MED ORDER — ACETAMINOPHEN 160 MG/5ML PO SOLN
325.0000 mg | ORAL | Status: DC | PRN
  Filled 2016-04-18: qty 20.3

## 2016-04-18 MED ORDER — SODIUM CHLORIDE 0.9% FLUSH
3.0000 mL | Freq: Two times a day (BID) | INTRAVENOUS | Status: DC
  Administered 2016-04-18: 3 mL via INTRAVENOUS

## 2016-04-18 MED ORDER — PROPOFOL 10 MG/ML IV BOLUS
INTRAVENOUS | Status: DC | PRN
  Administered 2016-04-18: 170 mg via INTRAVENOUS

## 2016-04-18 MED ORDER — CEFAZOLIN IN D5W 1 GM/50ML IV SOLN
1.0000 g | Freq: Three times a day (TID) | INTRAVENOUS | Status: AC
  Administered 2016-04-18 – 2016-04-19 (×2): 1 g via INTRAVENOUS
  Filled 2016-04-18 (×2): qty 50

## 2016-04-18 MED ORDER — ACETAMINOPHEN 325 MG PO TABS: 325.0000 mg | ORAL_TABLET | ORAL | Status: DC | PRN

## 2016-04-18 MED ORDER — PROPOFOL 10 MG/ML IV BOLUS
INTRAVENOUS | Status: AC
  Filled 2016-04-18: qty 20

## 2016-04-18 MED ORDER — SODIUM CHLORIDE 0.9 % IV SOLN: INTRAVENOUS | Status: DC

## 2016-04-18 MED ORDER — ACETAMINOPHEN 650 MG RE SUPP: 650.0000 mg | RECTAL | Status: DC | PRN

## 2016-04-18 MED ORDER — OXYCODONE HCL 5 MG PO TABS
5.0000 mg | ORAL_TABLET | Freq: Once | ORAL | Status: DC | PRN
Start: 2016-04-18 — End: 2016-04-18

## 2016-04-18 MED ORDER — HYDRALAZINE HCL 20 MG/ML IJ SOLN
INTRAMUSCULAR | Status: AC
  Filled 2016-04-18: qty 1

## 2016-04-18 MED ORDER — FLEET ENEMA 7-19 GM/118ML RE ENEM: 1.0000 | ENEMA | Freq: Once | RECTAL | Status: DC | PRN

## 2016-04-18 MED ORDER — MORPHINE SULFATE (PF) 2 MG/ML IV SOLN: 1.0000 mg | INTRAVENOUS | Status: DC | PRN

## 2016-04-18 MED ORDER — FENTANYL CITRATE (PF) 250 MCG/5ML IJ SOLN
INTRAMUSCULAR | Status: AC
  Filled 2016-04-18: qty 5

## 2016-04-18 MED ORDER — HYDROMORPHONE HCL 1 MG/ML IJ SOLN
0.2500 mg | INTRAMUSCULAR | Status: DC | PRN
  Administered 2016-04-18: 1 mg via INTRAVENOUS

## 2016-04-18 MED ORDER — BUPIVACAINE-EPINEPHRINE 0.25% -1:200000 IJ SOLN
INTRAMUSCULAR | Status: DC | PRN
  Administered 2016-04-18: 6 mL

## 2016-04-18 MED ORDER — ACETAMINOPHEN 325 MG PO TABS
650.0000 mg | ORAL_TABLET | ORAL | Status: DC | PRN
  Administered 2016-04-18: 650 mg via ORAL
  Filled 2016-04-18: qty 2

## 2016-04-18 MED ORDER — OXYCODONE-ACETAMINOPHEN 5-325 MG PO TABS
1.0000 | ORAL_TABLET | ORAL | Status: DC | PRN
  Administered 2016-04-18 – 2016-04-19 (×2): 2 via ORAL
  Filled 2016-04-18 (×2): qty 2

## 2016-04-18 MED ORDER — SUGAMMADEX SODIUM 500 MG/5ML IV SOLN
INTRAVENOUS | Status: AC
  Filled 2016-04-18: qty 5

## 2016-04-18 MED ORDER — PHENOL 1.4 % MT LIQD
1.0000 | OROMUCOSAL | Status: DC | PRN
Start: 2016-04-18 — End: 2016-04-19

## 2016-04-18 MED ORDER — DOCUSATE SODIUM 100 MG PO CAPS
100.0000 mg | ORAL_CAPSULE | Freq: Two times a day (BID) | ORAL | Status: DC
  Administered 2016-04-18: 100 mg via ORAL
  Filled 2016-04-18: qty 1

## 2016-04-18 MED ORDER — LACTATED RINGERS IV SOLN
INTRAVENOUS | Status: DC
  Administered 2016-04-18: 10:00:00 via INTRAVENOUS

## 2016-04-18 MED ORDER — THROMBIN 20000 UNITS EX SOLR
CUTANEOUS | Status: AC
  Filled 2016-04-18: qty 20000

## 2016-04-18 MED ORDER — LABETALOL HCL 5 MG/ML IV SOLN
INTRAVENOUS | Status: AC
  Filled 2016-04-18: qty 4

## 2016-04-18 MED ORDER — DIAZEPAM 5 MG PO TABS
5.0000 mg | ORAL_TABLET | Freq: Four times a day (QID) | ORAL | Status: DC | PRN
  Administered 2016-04-18: 5 mg via ORAL
  Filled 2016-04-18: qty 1

## 2016-04-18 MED ORDER — PHENYLEPHRINE HCL 10 MG/ML IJ SOLN
INTRAMUSCULAR | Status: DC | PRN
  Administered 2016-04-18 (×2): 80 ug via INTRAVENOUS

## 2016-04-18 SURGICAL SUPPLY — 82 items
BENZOIN TINCTURE PRP APPL 2/3 (GAUZE/BANDAGES/DRESSINGS) ×3 IMPLANT
BIT DRILL NEURO 2X3.1 SFT TUCH (MISCELLANEOUS) ×1 IMPLANT
BIT DRILL SRG 14X2.2XFLT CHK (BIT) ×1 IMPLANT
BIT DRL SRG 14X2.2XFLT CHK (BIT) ×1
BLADE SURG 15 STRL LF DISP TIS (BLADE) ×1 IMPLANT
BLADE SURG 15 STRL SS (BLADE) ×2
BLADE SURG ROTATE 9660 (MISCELLANEOUS) ×3 IMPLANT
BUR MATCHSTICK NEURO 3.0 LAGG (BURR) IMPLANT
CARTRIDGE OIL MAESTRO DRILL (MISCELLANEOUS) ×1 IMPLANT
CLOSURE STERI-STRIP 1/2X4 (GAUZE/BANDAGES/DRESSINGS) ×1
CLOSURE WOUND 1/2 X4 (GAUZE/BANDAGES/DRESSINGS) ×1
CLSR STERI-STRIP ANTIMIC 1/2X4 (GAUZE/BANDAGES/DRESSINGS) ×2 IMPLANT
COLLAR CERV LO CONTOUR FIRM DE (SOFTGOODS) IMPLANT
CORDS BIPOLAR (ELECTRODE) ×3 IMPLANT
COVER SURGICAL LIGHT HANDLE (MISCELLANEOUS) ×3 IMPLANT
CRADLE DONUT ADULT HEAD (MISCELLANEOUS) ×3 IMPLANT
DECANTER SPIKE VIAL GLASS SM (MISCELLANEOUS) ×3 IMPLANT
DIFFUSER DRILL AIR PNEUMATIC (MISCELLANEOUS) ×3 IMPLANT
DRAIN JACKSON RD 7FR 3/32 (WOUND CARE) IMPLANT
DRAPE C-ARM 42X72 X-RAY (DRAPES) ×3 IMPLANT
DRAPE POUCH INSTRU U-SHP 10X18 (DRAPES) ×3 IMPLANT
DRAPE SURG 17X23 STRL (DRAPES) ×9 IMPLANT
DRILL BIT SKYLINE 14MM (BIT) ×2
DRILL NEURO 2X3.1 SOFT TOUCH (MISCELLANEOUS) ×3
DURAPREP 26ML APPLICATOR (WOUND CARE) ×3 IMPLANT
ELECT COATED BLADE 2.86 ST (ELECTRODE) ×3 IMPLANT
ELECT REM PT RETURN 9FT ADLT (ELECTROSURGICAL) ×3
ELECTRODE REM PT RTRN 9FT ADLT (ELECTROSURGICAL) ×1 IMPLANT
EVACUATOR SILICONE 100CC (DRAIN) IMPLANT
GAUZE SPONGE 4X4 12PLY STRL (GAUZE/BANDAGES/DRESSINGS) ×3 IMPLANT
GAUZE SPONGE 4X4 16PLY XRAY LF (GAUZE/BANDAGES/DRESSINGS) ×3 IMPLANT
GLOVE BIO SURGEON STRL SZ7 (GLOVE) ×6 IMPLANT
GLOVE BIO SURGEON STRL SZ8 (GLOVE) ×3 IMPLANT
GLOVE BIOGEL PI IND STRL 7.0 (GLOVE) ×2 IMPLANT
GLOVE BIOGEL PI IND STRL 7.5 (GLOVE) ×2 IMPLANT
GLOVE BIOGEL PI IND STRL 8 (GLOVE) ×1 IMPLANT
GLOVE BIOGEL PI INDICATOR 7.0 (GLOVE) ×4
GLOVE BIOGEL PI INDICATOR 7.5 (GLOVE) ×4
GLOVE BIOGEL PI INDICATOR 8 (GLOVE) ×2
GLOVE SURG SS PI 7.0 STRL IVOR (GLOVE) ×6 IMPLANT
GOWN STRL REUS W/ TWL LRG LVL3 (GOWN DISPOSABLE) ×1 IMPLANT
GOWN STRL REUS W/ TWL XL LVL3 (GOWN DISPOSABLE) ×1 IMPLANT
GOWN STRL REUS W/TWL LRG LVL3 (GOWN DISPOSABLE) ×2
GOWN STRL REUS W/TWL XL LVL3 (GOWN DISPOSABLE) ×2
INTERLOCK LRDTC CRVCL VBR 6MM (Bone Implant) ×1 IMPLANT
INTERLOCK LRDTC CRVCL VBR 7MM (Bone Implant) ×1 IMPLANT
IV CATH 14GX2 1/4 (CATHETERS) ×3 IMPLANT
KIT BASIN OR (CUSTOM PROCEDURE TRAY) ×3 IMPLANT
KIT ROOM TURNOVER OR (KITS) ×3 IMPLANT
LORDOTIC CERVICAL VBR 6MM SM (Bone Implant) ×3 IMPLANT
LORDOTIC CERVICAL VBR 7MM SM (Bone Implant) ×3 IMPLANT
MANIFOLD NEPTUNE II (INSTRUMENTS) ×3 IMPLANT
NEEDLE 27GAX1X1/2 (NEEDLE) ×3 IMPLANT
NEEDLE SPNL 20GX3.5 QUINCKE YW (NEEDLE) ×3 IMPLANT
NS IRRIG 1000ML POUR BTL (IV SOLUTION) ×3 IMPLANT
OIL CARTRIDGE MAESTRO DRILL (MISCELLANEOUS) ×3
PACK ORTHO CERVICAL (CUSTOM PROCEDURE TRAY) ×3 IMPLANT
PAD ARMBOARD 7.5X6 YLW CONV (MISCELLANEOUS) ×6 IMPLANT
PATTIES SURGICAL .5 X.5 (GAUZE/BANDAGES/DRESSINGS) IMPLANT
PATTIES SURGICAL .5 X1 (DISPOSABLE) IMPLANT
PIN DISTRACTION 14 (PIN) ×6 IMPLANT
PLATE TWO LEVEL SKYLINE 30MM (Plate) ×3 IMPLANT
PUTTY BONE DBX 2.5 MIS (Bone Implant) ×3 IMPLANT
SCREW SKYLINE VAR OS 14MM (Screw) ×18 IMPLANT
SPONGE GAUZE 4X4 12PLY STER LF (GAUZE/BANDAGES/DRESSINGS) ×3 IMPLANT
SPONGE INTESTINAL PEANUT (DISPOSABLE) ×6 IMPLANT
SPONGE SURGIFOAM ABS GEL 100 (HEMOSTASIS) ×3 IMPLANT
STRIP CLOSURE SKIN 1/2X4 (GAUZE/BANDAGES/DRESSINGS) ×2 IMPLANT
SURGIFLO W/THROMBIN 8M KIT (HEMOSTASIS) IMPLANT
SUT MNCRL AB 4-0 PS2 18 (SUTURE) ×3 IMPLANT
SUT SILK 4 0 (SUTURE)
SUT SILK 4-0 18XBRD TIE 12 (SUTURE) IMPLANT
SUT VIC AB 2-0 CT2 18 VCP726D (SUTURE) ×3 IMPLANT
SYR BULB IRRIGATION 50ML (SYRINGE) ×3 IMPLANT
SYR CONTROL 10ML LL (SYRINGE) ×6 IMPLANT
TAPE CLOTH 4X10 WHT NS (GAUZE/BANDAGES/DRESSINGS) ×3 IMPLANT
TAPE CLOTH SURG 4X10 WHT LF (GAUZE/BANDAGES/DRESSINGS) ×3 IMPLANT
TAPE UMBILICAL COTTON 1/8X30 (MISCELLANEOUS) ×3 IMPLANT
TOWEL OR 17X24 6PK STRL BLUE (TOWEL DISPOSABLE) ×3 IMPLANT
TOWEL OR 17X26 10 PK STRL BLUE (TOWEL DISPOSABLE) ×3 IMPLANT
WATER STERILE IRR 1000ML POUR (IV SOLUTION) ×3 IMPLANT
YANKAUER SUCT BULB TIP NO VENT (SUCTIONS) ×3 IMPLANT

## 2016-04-18 NOTE — Anesthesia Preprocedure Evaluation (Addendum)
Anesthesia Evaluation  Patient identified by MRN, date of birth, ID band Patient awake    Reviewed: Allergy & Precautions, H&P , NPO status , Patient's Chart, lab work & pertinent test results  History of Anesthesia Complications Negative for: history of anesthetic complications  Airway Mallampati: III  TM Distance: >3 FB Neck ROM: limited    Dental  (+) Teeth Intact, Dental Advidsory Given   Pulmonary neg shortness of breath, sleep apnea and Continuous Positive Airway Pressure Ventilation , neg COPD,    breath sounds clear to auscultation       Cardiovascular negative cardio ROS   Rhythm:regular Rate:Normal     Neuro/Psych negative neurological ROS  negative psych ROS   GI/Hepatic Neg liver ROS, GERD  Medicated and Controlled,  Endo/Other  Morbid obesity  Renal/GU negative Renal ROS     Musculoskeletal   Abdominal   Peds  Hematology   Anesthesia Other Findings   Reproductive/Obstetrics                            Anesthesia Physical Anesthesia Plan  ASA: II  Anesthesia Plan: General   Post-op Pain Management:    Induction: Intravenous  Airway Management Planned: Oral ETT  Additional Equipment: None  Intra-op Plan:   Post-operative Plan: Extubation in OR  Informed Consent: I have reviewed the patients History and Physical, chart, labs and discussed the procedure including the risks, benefits and alternatives for the proposed anesthesia with the patient or authorized representative who has indicated his/her understanding and acceptance.   Dental Advisory Given  Plan Discussed with: Anesthesiologist, CRNA and Surgeon  Anesthesia Plan Comments:        Anesthesia Quick Evaluation

## 2016-04-18 NOTE — Progress Notes (Signed)
Conversation w/ Dr Idleman Robert re: persistant  Elevated  SBP + DBP>100 in kllight of pain being well controlled/ Arrived to preop this am with same and denies h/o HTN or the need to be on meds at anytime for HTN. Orders received for labetalol

## 2016-04-18 NOTE — Transfer of Care (Signed)
Immediate Anesthesia Transfer of Care Note  Patient: Chase Drake  Procedure(s) Performed: Procedure(s) with comments: ANTERIOR CERVICAL DECOMPRESSION FUSION CERVICAL 5-6, CERVICAL 6-7 WITH INSTRUMENTATION AND ALLOGRAFT (N/A) - ANTERIOR CERVICAL DECOMPRESSION FUSION CERVICAL 5-6, CERVICAL 6-7 WITH INSTRUMENTATION AND ALLOGRAFT  Patient Location: PACU  Anesthesia Type:General  Level of Consciousness: awake, alert  and oriented  Airway & Oxygen Therapy: Patient Spontanous Breathing and Patient connected to nasal cannula oxygen  Post-op Assessment: Report given to RN, Post -op Vital signs reviewed and stable and Patient moving all extremities X 4  Post vital signs: Reviewed and stable  Last Vitals:  Filed Vitals:   04/18/16 0930 04/18/16 0948  BP:  151/110  Pulse: 76   Temp: 36.9 C   Resp: 18     Last Pain:  Filed Vitals:   04/18/16 0959  PainSc: 7          Complications: No apparent anesthesia complications

## 2016-04-18 NOTE — Op Note (Signed)
Date of Surgery: 04/18/2016  PREOPERATIVE DIAGNOSES: 1. Right-sided cervical radiculopathy. 2. Neuroforaminal stenosis spanning C5-C7 3. Severe DDD C5-C7  POSTOPERATIVE DIAGNOSES: 1. Right-sided cervical radiculopathy. 2. Neuroforaminal stenosis spanning C5-C7 3. Severe DDD C5-C7  PROCEDURE: 1. Anterior cervical decompression and fusion C5-C7 2. Placement of anterior instrumentation, C5-C7 3. Insertion of interbody device x 2 (Titan intervertebral spacers). 4. Use of morselized allograft. 5. Intraoperative use of fluoroscopy.  SURGEON: Phylliss Bob, MD.  ASSISTANTLonn Georgia McKenzie PA-C.  ANESTHESIA: General endotracheal anesthesia.  COMPLICATIONS: None.  DISPOSITION: Stable.  ESTIMATED BLOOD LOSS: Minimal.  INDICATIONS FOR SURGERY: Briefly, Chase Drake is a pleasant 50 year old male, who did present to me with a history of ongoing pain in the right arm. An MRI did reveal the findings noted above. Given the  patient's ongoing pain and the findings on his MRI and lack of improvement with  appropriate nonoperative measures, we did  discuss proceeding with the procedure noted above. The patient was fully aware of the risks and limitations of the surgery, and did elect to proceed.  OPERATIVE DETAILS: On 04/18/2016, the patient was brought to surgery and general endotracheal anesthesia was administered. The patient was placed supine on the hospital bed. The patient's neck was gently extended. The patient's arms were secured to his sides. All bony prominences were padded. The neck was prepped and draped. A left-sided transverse incision was then made. The platysma was incised. A Alben Deeds approach was utilized. A self-retaining retractor was placed and the vertebral bodies to be fused were subperiosteally exposed. Caspar pins were then placed into the C6 and C7 vertebral bodies and distraction was applied. I then proceeded with a thorough and  complete C6/7 intervertebral diskectomy. I was very pleased with the decompression  that I was able to accomplish. An appropriate decompression was confirmed  using a nerve hook bilaterally. The endplates were then prepared and the appropriate sized interbody spacer was packed with DBX mix and tamped into position in the usual fashion. The lower Caspar pin was removed and bone wax was then placed in its place. A new Caspar pin was placed into the C5 vertebral body and distraction was applied across the C5/6 intervertebral space. Once again, a thorough diskectomy was performed. There was no stenosis noted at the completion of the diskectomy, this was confirmed using a nerve hook bilaterally. The endplates were then prepared and the appropriate sized interbody spacer was packed with DBX mix and tamped into position in the usual fashion. I was very pleased with the press-fit of the spacers. The Caspar pins were removed and bone wax was placed in their place. I then selected the appropriate sized anterior cervical plate, which was placed over the anterior spine. 14 mm variable angle screws were placed, 2 in each vertebral body from C5 to C7 for a total of 6 vertebral body screws. The screws were then locked to the plate using the Cam locking mechanism. I was very pleased with the purchase of each of the screws. I was very pleased with the final fluoroscopic images. The wound was then irrigated. All bleeding was controlled using bipolar electrocautery. The wound was then closed in layers. The platysma was closed using 2-0 Vicryl and the skin was closed using 3-0 Monocryl. Benzoin and Steri-Strips were then applied followed by sterile dressing. All instrument counts were correct at the termination of the procedure.  Of note, Chase Drake was my assistant throughout surgery, and did aid in retraction, suctioning, and closure from start to finish.  Phylliss Bob, MD

## 2016-04-18 NOTE — Anesthesia Procedure Notes (Signed)
Procedure Name: Intubation Date/Time: 04/18/2016 12:00 PM Performed by: Neldon Newport Pre-anesthesia Checklist: Patient identified, Emergency Drugs available, Suction available, Patient being monitored and Timeout performed Patient Re-evaluated:Patient Re-evaluated prior to inductionOxygen Delivery Method: Circle system utilized Preoxygenation: Pre-oxygenation with 100% oxygen Intubation Type: IV induction Ventilation: Mask ventilation without difficulty and Oral airway inserted - appropriate to patient size Laryngoscope Size: Glidescope and 4 Tube type: Oral Tube size: 7.5 mm Number of attempts: 1 Airway Equipment and Method: Stylet and Video-laryngoscopy Placement Confirmation: ETT inserted through vocal cords under direct vision,  positive ETCO2 and breath sounds checked- equal and bilateral Secured at: 23 cm Tube secured with: Tape Dental Injury: Teeth and Oropharynx as per pre-operative assessment

## 2016-04-18 NOTE — Anesthesia Postprocedure Evaluation (Signed)
Anesthesia Post Note  Patient: Chase Drake  Procedure(s) Performed: Procedure(s) (LRB): ANTERIOR CERVICAL DECOMPRESSION FUSION CERVICAL 5-6, CERVICAL 6-7 WITH INSTRUMENTATION AND ALLOGRAFT (N/A)  Patient location during evaluation: PACU Anesthesia Type: General Level of consciousness: awake and alert Pain management: pain level controlled Vital Signs Assessment: post-procedure vital signs reviewed and stable Respiratory status: spontaneous breathing, nonlabored ventilation, respiratory function stable and patient connected to nasal cannula oxygen Cardiovascular status: blood pressure returned to baseline and stable Postop Assessment: no signs of nausea or vomiting Anesthetic complications: no    Last Vitals:  Filed Vitals:   04/18/16 1612 04/18/16 1646  BP:  143/84  Pulse: 86 87  Temp:  36.6 C  Resp: 16 16    Last Pain:  Filed Vitals:   04/18/16 1656  PainSc: Asleep                 Effie Berkshire

## 2016-04-18 NOTE — Progress Notes (Signed)
Inadequate response of dbp goal of <100 after labetalol. Spoke with Dr Veatrice Kells re same, orders rec'd for hydralazine IV.

## 2016-04-18 NOTE — Progress Notes (Signed)
Orthopedic Tech Progress Note Patient Details:  Chase Drake 03/20/1966 [id number]  Ortho Devices Type of Ortho Device: [place] cervical collar Ortho Device/Splint Interventions: Application   [place] Pink 04/18/2016, 6:54 PM

## 2016-04-19 DIAGNOSIS — M4802 Spinal stenosis, cervical region: Secondary | ICD-10-CM | POA: Diagnosis not present

## 2016-04-19 MED FILL — Thrombin For Soln 20000 Unit: CUTANEOUS | Qty: 1 | Status: AC

## 2016-04-19 NOTE — Progress Notes (Signed)
    Patient doing well Patient denies R or L arm pain   Physical Exam: Filed Vitals:   04/18/16 2321 04/19/16 0330  BP: 150/81 154/84  Pulse: 100 100  Temp: 98.3 F (36.8 C) 98.3 F (36.8 C)  Resp: 18 20    Dressing in place NVI  Neck soft/supple Dressing in place NVI  POD #1 s/p C5-C7 ACDF doing well  - encourage ambulation  - Percocet for pain, Valium for muscle spasms - likely d/c home today with f/u in 2 weeks

## 2016-04-19 NOTE — Progress Notes (Signed)
Patient alert and oriented, mae's well, voiding adequate amount of urine, swallowing without difficulty, no c/o pain. Patient discharged home with family. Script and discharged instructions given to patient. Patient and family stated understanding of d/c instructions given and has an appointment with MD. 

## 2016-04-22 ENCOUNTER — Encounter (HOSPITAL_COMMUNITY): Payer: Self-pay | Admitting: Orthopedic Surgery

## 2016-05-22 NOTE — Discharge Summary (Signed)
    Patient ID: Per Notaro MRN: [id number] DOB/AGE: 1966-03-11 50 y.o.  Admit date: 04/18/2016 Discharge date: 04/19/2016  Admission Diagnoses:  Active Problems:   Radiculopathy   Discharge Diagnoses:  Same  Past Medical History:  Diagnosis Date  . Anxiety   . Atypical chest pain   . Dyspepsia   . Dysplastic nevus   . GERD (gastroesophageal reflux disease)   . Hyperglycemia   . Hyperlipidemia   . Lactose intolerance   . Obesity   . OSA (obstructive sleep apnea)    uses Cpap  . Right shoulder pain   . Somnolence     Surgeries: Procedure(s): ANTERIOR CERVICAL DECOMPRESSION FUSION CERVICAL 5-6, CERVICAL 6-7 WITH INSTRUMENTATION AND ALLOGRAFT on 04/18/2016   Consultants: None  Discharged Condition: Improved  Hospital Course: Diomar Breese is an 50 y.o. male who was admitted 04/18/2016 for operative treatment of radiculopathy. Patient has severe unremitting pain that affects sleep, daily activities, and work/hobbies. After pre-op clearance the patient was taken to the operating room on 04/18/2016 and underwent  Procedure(s): ANTERIOR CERVICAL DECOMPRESSION FUSION CERVICAL 5-6, CERVICAL 6-7 WITH INSTRUMENTATION AND ALLOGRAFT.    Patient was given perioperative antibiotics:  Anti-infectives    Start     Dose/Rate Route Frequency Ordered Stop   04/18/16 2000  ceFAZolin (ANCEF) IVPB 1 g/50 mL premix     1 g 100 mL/hr over 30 Minutes Intravenous Every 8 hours 04/18/16 1653 04/19/16 0352   04/18/16 1130  ceFAZolin (ANCEF) IVPB 2g/100 mL premix     2 g 200 mL/hr over 30 Minutes Intravenous To [facility]Stay [facility] 04/17/16 1423 04/18/16 1231       Patient was given sequential compression devices, early ambulation to prevent DVT.  Patient benefited maximally from hospital stay and there were no complications.    Recent vital signs: BP (!) 152/89   Pulse 86   Temp 98 F (36.7 C)   Resp 18   Ht 5\' 6"  (1.676 m)   Wt 104.3 kg (230 lb)   SpO2 98%   BMI 37.12 kg/m    Discharge Medications:     Medication List    TAKE these medications   fexofenadine 180 MG tablet Commonly known as:  ALLEGRA Take 180 mg by mouth daily.   omeprazole 20 MG capsule Commonly known as:  PRILOSEC Take 20 mg by mouth daily.       Diagnostic Studies: No results found.  Disposition: 01-Home or Self Care   POD #1 s/p C5-C7 ACDF doing well  - encourage ambulation  - Percocet for pain, Valium for muscle spasms -Written scripts for pain signed and in chart -D/C instructions sheet printed and in chart -D/C today  -F/U in office 2 weeks   Signed: Justice Britain 05/22/2016, 11:39 AM

## 2016-06-03 ENCOUNTER — Telehealth: Payer: Self-pay

## 2016-06-03 ENCOUNTER — Ambulatory Visit: Payer: BC Managed Care – PPO | Admitting: Neurology

## 2016-06-03 NOTE — Telephone Encounter (Signed)
Patient did not show to appt.  

## 2016-06-07 ENCOUNTER — Encounter: Payer: Self-pay | Admitting: Neurology

## 2017-08-09 IMAGING — RF DG C-ARM 61-120 MIN
1 series · 2 of 2 positions shown · non-contrast
Comparison: Cervical MRI, 04/09/2016

CLINICAL DATA: ANTERIOR CERVICAL DECOMPRESSION FUSION CERVICAL 5-6,
CERVICAL 6-7 WITH INSTRUMENTATION AND ALLOGRAFT, used 15 seconds of
fluoro

EXAM:
DG C-ARM 61-120 MIN; CERVICAL SPINE - 2-3 VIEW

[Series 1: run · 2 of 2 slices shown]
[im 1/2]
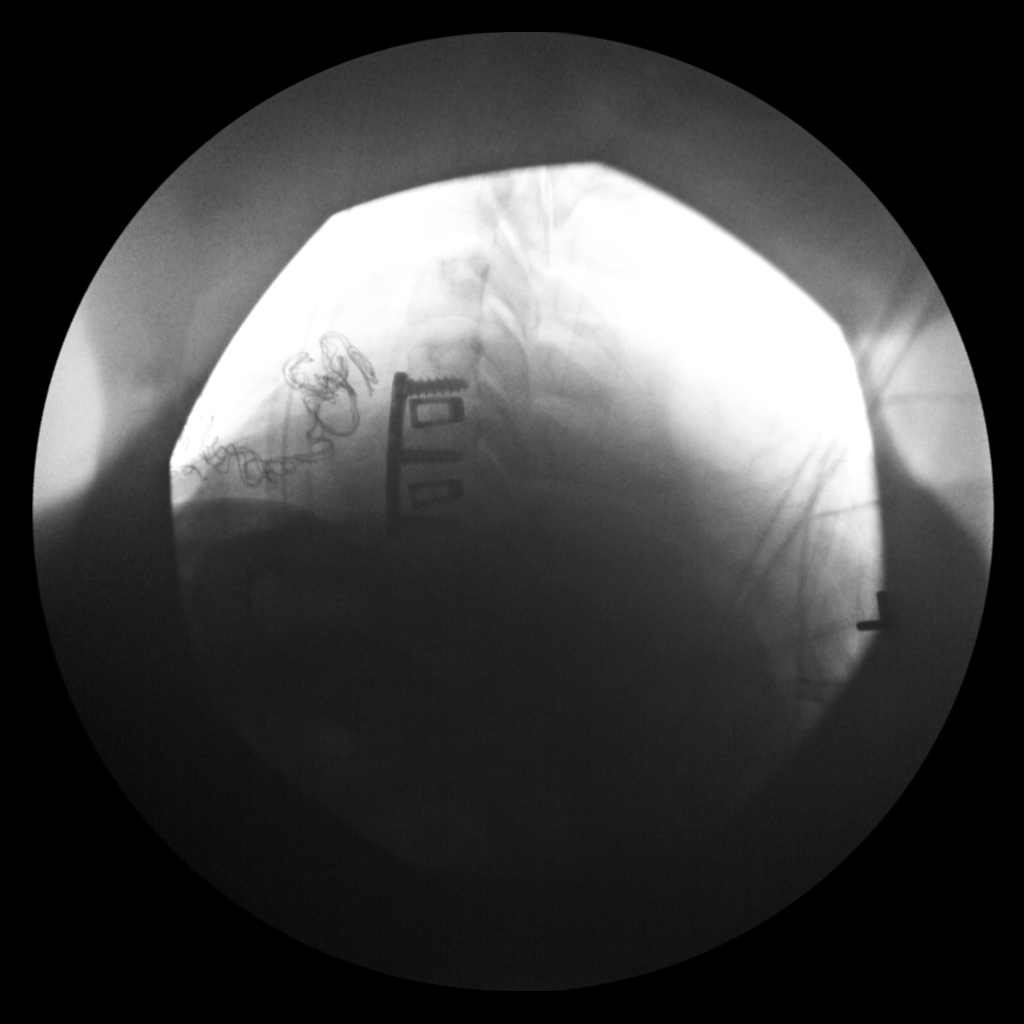
[im 2/2]
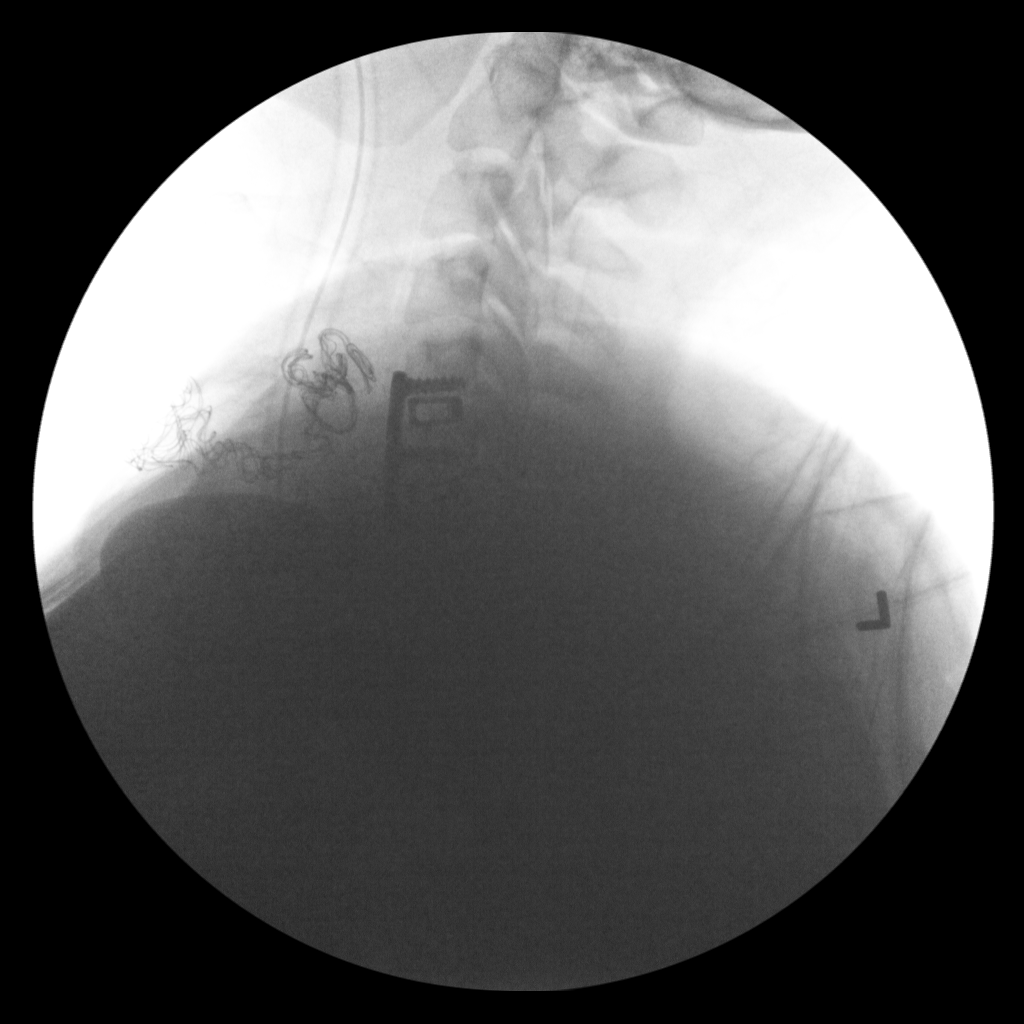

[2 of 2 positions shown; findings below may reference images not displayed]

FINDINGS: The 2 submitted images demonstrate placement of an anterior fusion
plate and fixation screws from C5 through C7 with metal disc spacers
maintaining disc height at diffuse levels. The orthopedic hardware
appears well positioned.
IMPRESSION: Imaging from cervical spine fusion from C5 through C7.

## 2019-10-14 ENCOUNTER — Other Ambulatory Visit: Payer: Self-pay | Admitting: Internal Medicine

## 2019-10-14 DIAGNOSIS — E785 Hyperlipidemia, unspecified: Secondary | ICD-10-CM

## 2020-05-30 ENCOUNTER — Encounter: Payer: Self-pay | Admitting: Neurology

## 2020-05-30 ENCOUNTER — Ambulatory Visit: Payer: BC Managed Care – PPO | Admitting: Neurology

## 2020-05-30 VITALS — BP 134/94 | HR 82 | Ht 67.0 in | Wt 219.0 lb

## 2020-05-30 DIAGNOSIS — Z9989 Dependence on other enabling machines and devices: Secondary | ICD-10-CM

## 2020-05-30 DIAGNOSIS — G4733 Obstructive sleep apnea (adult) (pediatric): Secondary | ICD-10-CM | POA: Diagnosis not present

## 2020-05-30 DIAGNOSIS — Z789 Other specified health status: Secondary | ICD-10-CM | POA: Diagnosis not present

## 2020-05-30 NOTE — Patient Instructions (Signed)
Please continue to use your CPAP as best as you can, the pressure setting is good, seal from the mask is also good but we can certainly have you try nasal pillows again.  I placed an order for new supplies through your DME company.  Please also be in touch with them regarding the recall on the DreamStation CPAP machines.  You may have to register your machine on a website for this.  They may guide you better through your DME company.  As discussed, we will reduce your ramp time to 10 minutes and increase your ramp pressure to 10 cm.  Please keep [place] updated, call or email [place] through [place] for an update in about 2 weeks.  We can make further adjustments if need be.  Please follow-up in 3 months to see one of our nurse practitioners and then yearly thereafter if things go well.

## 2020-05-30 NOTE — Progress Notes (Signed)
Subjective:    Patient ID: Chase Drake is a 54 y.o. male.  HPI     Chase Age, MD, PhD [facility] [address] [place],  [address] Dear Dr. Virgina Drake,   I saw your patient, Chase Drake. Sear, upon your kind request, in my sleep clinic today for re-evaluation of his sleep apnea. The patient is unaccompanied today.  He presents after a long gap of nearly 3-1/2 years.  As you know, Chase Drake is a 54 year old right-handed gentleman with an underlying medical history of anxiety, hyperlipidemia, allergic rhinitis, atypical chest pain, Crohn's disease, eosinophilic esophagitis, restless leg syndrome, and obesity, who was diagnosed with severe obstructive sleep apnea in November 2016.  He was placed on CPAP therapy.  I saw him on 12/05/2015, at which time he reported doing better with CPAP therapy, he was resting better and his daytime somnolence had improved.  He was compliant with CPAP therapy and advised to follow-up in 6 months and yearly thereafter.  He missed an appointment in August 2017 and was lost to follow-up after that.  He reports that he has struggled with his CPAP machine.  He has difficulty tolerating it and keeping the mask in place.  He has had issues with anxiety.  He has seen his dentist because of jaw clenching and teeth grinding and has started using a bite guard.  He tries to be compliant with the CPAP.  He has received his supplies previously through the DME company but lately he ordered supplies online.  He is currently using a wisp nasal mask but would like to try the nasal pillows again.  Bedtime is around 9 or 10 and rise time around 6 AM.  He is a Pharmacist, hospital.  He takes Ativan at night to help him sleep.  He sees Chase Drake for mental health follow-up.  I reviewed his CPAP compliance data from 04/30/2020 through 05/29/2020, which is a total of 30 days, during which time he used his machine 29 days with percent use days greater than 4 hours  at 53.3%, indicating slightly suboptimal compliance, average usage for days on treatment of 4 hours and 9 minutes.  Residual AHI at goal at 0.4/h, leak on the low side.  Pressure at 14 cm.   Previously:  I reviewed his CPAP compliance data from 04/28/2016 through 05/27/2016 which is a total of 30 days during which time he used his machine every night with percent used days greater than 4 hours at 100%, indicating superb compliance with an average usage of 8 hours and 30 minutes, residual AHI 2.2 per hour, leaked low, pressure at 14 cm.   Of note, the patient no showed for an appointment on 11/14/2015.   I first met him on 08/14/2015 at the request of his primary care physician, at which time the patient reported snoring, nonrestorative sleep, difficulty initiating sleep and excessive daytime somnolence. I invited him back for sleep study. He had a split-night sleep study on 09/04/2015. I went over his test results with him in detail today. Baseline sleep efficiency was 70.6% with a sleep latency of 11 minutes and wake after sleep onset of 48.5 minutes with severe sleep fragmentation noted. He had an elevated arousal index. He had absence of slow-wave sleep and REM sleep was 19.6% with a mildly prolonged REM latency. He had no significant EKG or EEG changes. He had moderate PLMS with an index of 39 per hour, resulting in 6.3 arousals per hour.  Moderate to loud snoring was noted, AHI was 55.8 per hour. Average oxygen saturation was 94%, nadir was 68% in REM sleep. He was then titrated on CPAP. Sleep efficiency was 84.5%, latency to sleep 3.5 minutes and wake after sleep onset was 40 minutes with mild to moderate sleep fragmentation noted. The arousal index was normal. He had absence of slow-wave sleep and REM sleep was increased at 34.2%. Average oxygen saturation was 96%, nadir was 89%. He had no significant PLMS or EKG or EEG changes during the second part of the study. CPAP was titrated from 5 cm to 14 cm.  AHI was 0 per hour on the final pressure with supine REM sleep achieved. Based on his test results are prescribed CPAP therapy for home use of a pressure of 14 cm he had nasal pillows.   I reviewed his CPAP compliance data from 11/05/2015 through 12/04/2015 which is a total of 30 days during which time he used his machine every night with percent used days greater than 4 hours at 93.3%, indicating excellent compliance with an average usage of 6 hours and 51 minutes, residual AHI low at 0.9 per hour, leaked low, pressure at 14 cm.    08/14/2015: He reports snoring and excessive daytime somnolence, problems going to sleep, and nonrestorative sleep. I reviewed your office note from 07/20/2015, which you kindly included. His Epworth sleepiness score is 13 out of 24 today, his fatigue score is 41 out of 63. He reports multiple nighttime awakenings. He has nocturia, on average 2-3 times per night. He works full-time as a Programmer, multimedia. His bedtime is around 9 PM. His rise time is around 5:30 AM. He does not wake up rested and usually feels groggy. He has occasional nightmares and vivid dreams. He has had vivid dreams for long time. He has been on Paxil for about 3 years. His weight has been stable. He is trying to lose weight. He does not drink alcohol or smoke. He drinks caffeine in the form of coffee, usually one per day. He has no TV in the bedroom. He has a dog in the bedroom at times but not in bed. He lives with his wife. He has 2 grown children. He has problems initiating and maintaining sleep at times. He does not take any medication for this. In the past, he took Mirapex for restless legs which was helpful. He no longer takes it. He ran out of the prescription a year ago or so. He has no family history of OSA. He had a tonsillectomy as a child. He has restless leg symptoms, sometimes in the middle of the night he has difficulty going back to sleep in his legs hurt and sometimes he has a crawling  sensation in his legs. His wife endorses that he twitches his legs and his sleep. His snoring can be loud. He has had pauses in his breathing per wife's report. He denies morning headaches.    His Past Medical History Is Significant For: Past Medical History:  Diagnosis Date  . Anxiety   . Atypical chest pain   . Dyspepsia   . Dysplastic nevus   . GERD (gastroesophageal reflux disease)   . Hyperglycemia   . Hyperlipidemia   . Lactose intolerance   . Obesity   . OSA (obstructive sleep apnea)    uses Cpap  . Right shoulder pain   . Somnolence     His Past Surgical History Is Significant For: Past Surgical History:  Procedure  Laterality Date  . ANTERIOR CERVICAL DECOMP/DISCECTOMY FUSION N/A 04/18/2016   Procedure: ANTERIOR CERVICAL DECOMPRESSION FUSION CERVICAL 5-6, CERVICAL 6-7 WITH INSTRUMENTATION AND ALLOGRAFT;  Surgeon: Phylliss Bob, MD;  Location: [facility];  Service: Orthopedics;  Laterality: N/A;  ANTERIOR CERVICAL DECOMPRESSION FUSION CERVICAL 5-6, CERVICAL 6-7 WITH INSTRUMENTATION AND ALLOGRAFT  . COLONOSCOPY    . TONSILLECTOMY AND ADENOIDECTOMY      His Family History Is Significant For: Family History  Problem Relation Drake of Onset  . Breast cancer Mother   . Benign prostatic hyperplasia Father   . Anxiety disorder Sister   . Asthma Brother     His Social History Is Significant For: Social History   Socioeconomic History  . Marital status: Married    Spouse name: Not on file  . Number of children: 2  . Years of education: BA  . Highest education level: Not on file  Occupational History  . Occupation: [employer]  Tobacco Use  . Smoking status: Never Smoker  . Smokeless tobacco: Never Used  Substance and Sexual Activity  . Alcohol use: Yes    Alcohol/week: 0.0 standard drinks    Comment: social on weekends  . Drug use: No  . Sexual activity: Not on file  Other Topics Concern  . Not on file  Social History Narrative   Drinks 1 cup of coffee a day     Social Determinants of Health   Financial Resource Strain:   . Difficulty of Paying Living Expenses:   Food Insecurity:   . Worried About Charity fundraiser in the Last Year:   . Arboriculturist in the Last Year:   Transportation Needs:   . Film/video editor (Medical):   Marland Kitchen Lack of Transportation (Non-Medical):   Physical Activity:   . Days of Exercise per Week:   . Minutes of Exercise per Session:   Stress:   . Feeling of Stress :   Social Connections:   . Frequency of Communication with Friends and Family:   . Frequency of Social Gatherings with Friends and Family:   . Attends Religious Services:   . Active Member of Clubs or Organizations:   . Attends Archivist Meetings:   Marland Kitchen Marital Status:     His Allergies Are:  Allergies  Allergen Reactions  . Fish Allergy Other (See Comments)  . Poultry Meal Other (See Comments)  :   His Current Medications Are:  Outpatient Encounter Medications as of 05/30/2020  Medication Sig  . fexofenadine (ALLEGRA) 180 MG tablet Take 180 mg by mouth daily.  Marland Kitchen omeprazole (PRILOSEC) 20 MG capsule Take 20 mg by mouth daily.   No facility-administered encounter medications on file as of 05/30/2020.  :   Review of Systems:  Out of a complete 14 point review of systems, all are reviewed and negative with the exception of these symptoms as listed below:  Review of Systems  Neurological:       Here for sleep consult. Prior study back in 2016, been on cpap since. Here to reestablish care. Reports he has been getting cpap supplies through [facility].  Epworth Sleepiness Scale 0= would never doze 1= slight chance of dozing 2= moderate chance of dozing 3= high chance of dozing  Sitting and reading:1 Watching TV:1 Sitting inactive in a public place (ex. Theater or meeting):0 As a passenger in a car for an hour without a break:1 Lying down to rest in the afternoon:2 Sitting and talking to someone:0 Sitting  quietly after lunch  (no alcohol):1 In a car, while stopped in traffic:0 Total:6 (This is a complaint CPAP user)    Objective:  Neurological Exam  Physical Exam Physical Examination:   Vitals:   05/30/20 0849  BP: (!) 134/94  Pulse: 82  SpO2: 97%    General Examination: The patient is a very pleasant 54 y.o. male in no acute distress. He appears well-developed and well-nourished and well groomed.   HEENT: Normocephalic, atraumatic, pupils are equal, round and reactive to light. Extraocular tracking is good without limitation to gaze excursion or nystagmus noted. Normal smooth pursuit is noted. Hearing is grossly intact. Face is symmetric with normal facial animation and normal facial sensation. Speech is clear with no dysarthria noted. There is no hypophonia. There is no lip, neck/head, jaw or voice tremor. Neck is supple with full range of passive and active motion. There are no carotid bruits on auscultation. Oropharynx exam reveals: mild mouth dryness, adequate dental hygiene and moderate airway crowding.  Tongue protrudes centrally and palate elevates symmetrically.  Chest: Clear to auscultation without wheezing, rhonchi or crackles noted.  Heart: S1+S2+0, regular and normal without murmurs, rubs or gallops noted.   Abdomen: Soft, non-tender and non-distended.  Extremities: There is no pitting edema in the distal lower extremities bilaterally.  Skin: Warm and dry without trophic changes noted. There are no varicose veins.  Musculoskeletal: exam reveals no obvious joint deformities, tenderness or joint swelling or erythema.   Neurologically:  Mental status: The patient is awake, alert and oriented in all 4 spheres. His immediate and remote memory, attention, language skills and fund of knowledge are appropriate. There is no evidence of aphasia, agnosia, apraxia or anomia. Speech is clear with normal prosody and enunciation. Thought process is linear. Mood is normal and affect is normal.   Cranial nerves II - XII are as described above under HEENT exam. In addition: shoulder shrug is normal with equal shoulder height noted. Motor exam: Normal bulk, strength and tone is noted. There is no drift, tremor or rebound. Romberg is negative. Fine motor skills and coordination: grossly intact.  Cerebellar testing: No dysmetria or intention tremor. There is no truncal or gait ataxia.  Sensory exam: intact to light touch in the upper and lower extremities.  Gait, station and balance: He stands easily. No veering to one side is noted. No leaning to one side is noted. Posture is Drake-appropriate and stance is narrow based. Gait shows normal stride length and normal pace. No problems turning are noted. He turns en bloc. Tandem walk is unremarkable.   Assessment and Plan:  In summary, Chase Drake is a very pleasant 54 year old male with an underlying medical history of anxiety, hyperlipidemia, allergic rhinitis, atypical chest pain, Crohn's disease, eosinophilic esophagitis, restless leg syndrome, and obesity, who presents for reevaluation of his sleep apnea.  He had been diagnosed with severe obstructive sleep apnea and had a split-night sleep study in November 2016.  He has been on CPAP therapy of 14 cm but has intermittently struggled with it.  He has benefited from treatment and is willing to continue with it and is commended for his treatment adherence.  He sometimes feels that when he first puts the mask on the air does not seem to blow high enough and he feels that he is struggling to breathe.  He also has struggled with anxiety issues intermittently which added to the challenges of tolerating the mask and the air pressure.  I suggested that we  reduce his ramp time from 20 minutes to 10 minutes and increase his starting pressure from 7cm to 10 cm at this time.  We can further make adjustments depending on how he feels with this.  He is encouraged to call us back or email through [facility] for an  update in about 2 weeks from now.  He is well treated with the CPAP pressure of 14 cm.  Also entered supplies for him to get through the [facility].  Furthermore, given the recall on the DreamStation CPAP machines he is advised to register his machine and also ask his DME company for further guidance in that regard.  He has not used a so clean machine and he has not exposed his machine to hot temperatures.  He would like to try the DreamWear nasal cushion again.  He is currently using a nasal mask.  He is advised to follow-up routinely to see one of our nurse practitioners in about 3 months and hopefully if he feels better and is stable he can be seen on a yearly basis after that.  I answered all his questions today and he was in agreement.  Thank you very much for allowing me to participate in the care of this nice patient. If I can be of any further assistance to you please do not hesitate to call me at [phone].  Sincerely,   Chase Age, MD, PhD

## 2020-05-30 NOTE — Progress Notes (Signed)
Cpap orders have been faxed to [facility]. I have also asked [facility] to call the pt about the phillips recall for his current machine.

## 2020-06-06 ENCOUNTER — Encounter: Payer: Self-pay | Admitting: Neurology

## 2020-07-31 ENCOUNTER — Encounter: Payer: Self-pay | Admitting: Neurology

## 2020-08-16 ENCOUNTER — Encounter: Payer: Self-pay | Admitting: Family Medicine

## 2020-08-16 ENCOUNTER — Ambulatory Visit: Payer: BC Managed Care – PPO | Admitting: Family Medicine

## 2020-08-24 ENCOUNTER — Ambulatory Visit: Payer: BC Managed Care – PPO | Admitting: Family Medicine

## 2020-10-12 ENCOUNTER — Encounter: Payer: Self-pay | Admitting: Neurology

## 2020-10-12 ENCOUNTER — Ambulatory Visit: Payer: BC Managed Care – PPO | Admitting: Neurology

## 2020-10-12 ENCOUNTER — Ambulatory Visit: Payer: Self-pay | Admitting: Neurology

## 2020-10-12 VITALS — BP 136/81 | HR 90 | Ht 66.5 in | Wt 215.5 lb

## 2020-10-12 DIAGNOSIS — G4733 Obstructive sleep apnea (adult) (pediatric): Secondary | ICD-10-CM

## 2020-10-12 DIAGNOSIS — Z9989 Dependence on other enabling machines and devices: Secondary | ICD-10-CM | POA: Diagnosis not present

## 2020-10-12 NOTE — Progress Notes (Signed)
Subjective:    Patient ID: Chase Drake is a 54 y.o. male.  HPI     [name], MD, PhD [facility] [address] [place],  [address]  Mr. Welcher is a 54 year old right-handed gentleman with an underlying medical history of anxiety, hyperlipidemia, allergic rhinitis, atypical chest pain, Crohn's disease, eosinophilic esophagitis, restless leg syndrome, and obesity, who presents for follow-up consultation of his obstructive sleep apnea, on CPAP therapy, with severe OSA Dx dating back to 2016.  The patient is unaccompanied today.  I last saw him on 05/30/2020, after a long gap, at which time he was encouraged to continue with CPAP therapy. He reported struggling with his CPAP machine. He had seen his dentist because of jaw clenching and teeth grinding and started using a bite guard. I wrote for new supplies including a new style of mask.  We talked about the recall on the [employer] machines. I reduced the ramp time to 10 minutes and increased initial ramp pressure to 10 cm. He was on a pressure of 14 cm.   Today, 10/12/2020: I reviewed his CPAP compliance data from Pelvic exam 09/11/2020 through 10/10/2020, which is a total of 30 days, during which time he used his machine every night with percent use days greater than 4 hours at 93.3%, indicating excellent compliance with an average usage of 6 hours and 27 minutes, residual AHI at goal at 0.8/h, leak on the lower side with high leak at 5 minutes and 57 seconds on average.  He reports doing well.  He has switched his DME company to [employer] and is pleased with his outcome thus far.  He is using nasal pillows with better tolerance.  His pressure is at 14 and he has turned off the ramp time.  He does admit to having some mouth dryness and reports that he has not been using a humidifier in the machine.  He would be willing to try the humidity feature again.  His Epworth sleepiness score currently is 6  out of 24.  He is motivated to continue with treatment.  He has no new concerns.  Previously:  I saw him on 12/05/2015, at which time he reported doing better with CPAP therapy, he was resting better and his daytime somnolence had improved.  He was compliant with CPAP therapy and advised to follow-up in 6 months and yearly thereafter.  He missed an appointment in August 2017 and was lost to follow-up after that.       I reviewed his CPAP compliance data from 04/28/2016 through 05/27/2016 which is a total of 30 days during which time he used his machine every night with percent used days greater than 4 hours at 100%, indicating superb compliance with an average usage of 8 hours and 30 minutes, residual AHI 2.2 per hour, leaked low, pressure at 14 cm.    Of note, the patient no showed for an appointment on 11/14/2015.    I first met him on 08/14/2015 at the request of his primary care physician, at which time the patient reported snoring, nonrestorative sleep, difficulty initiating sleep and excessive daytime somnolence. I invited him back for sleep study. He had a split-night sleep study on 09/04/2015. I went over his test results with him in detail today. Baseline sleep efficiency was 70.6% with a sleep latency of 11 minutes and wake after sleep onset of 48.5 minutes with severe sleep fragmentation noted. He had an elevated arousal index. He  had absence of slow-wave sleep and REM sleep was 19.6% with a mildly prolonged REM latency. He had no significant EKG or EEG changes. He had moderate PLMS with an index of 39 per hour, resulting in 6.3 arousals per hour. Moderate to loud snoring was noted, AHI was 55.8 per hour. Average oxygen saturation was 94%, nadir was 68% in REM sleep. He was then titrated on CPAP. Sleep efficiency was 84.5%, latency to sleep 3.5 minutes and wake after sleep onset was 40 minutes with mild to moderate sleep fragmentation noted. The arousal index was normal. He had absence of  slow-wave sleep and REM sleep was increased at 34.2%. Average oxygen saturation was 96%, nadir was 89%. He had no significant PLMS or EKG or EEG changes during the second part of the study. CPAP was titrated from 5 cm to 14 cm. AHI was 0 per hour on the final pressure with supine REM sleep achieved. Based on his test results are prescribed CPAP therapy for home use of a pressure of 14 cm he had nasal pillows.   I reviewed his CPAP compliance data from 11/05/2015 through 12/04/2015 which is a total of 30 days during which time he used his machine every night with percent used days greater than 4 hours at 93.3%, indicating excellent compliance with an average usage of 6 hours and 51 minutes, residual AHI low at 0.9 per hour, leaked low, pressure at 14 cm.    08/14/2015: He reports snoring and excessive daytime somnolence, problems going to sleep, and nonrestorative sleep. I reviewed your office note from 07/20/2015, which you kindly included. His Epworth sleepiness score is 13 out of 24 today, his fatigue score is 41 out of 63. He reports multiple nighttime awakenings. He has nocturia, on average 2-3 times per night. He works full-time as a Programmer, multimedia. His bedtime is around 9 PM. His rise time is around 5:30 AM. He does not wake up rested and usually feels groggy. He has occasional nightmares and vivid dreams. He has had vivid dreams for long time. He has been on Paxil for about 3 years. His weight has been stable. He is trying to lose weight. He does not drink alcohol or smoke. He drinks caffeine in the form of coffee, usually one per day. He has no TV in the bedroom. He has a dog in the bedroom at times but not in bed. He lives with his wife. He has 2 grown children. He has problems initiating and maintaining sleep at times. He does not take any medication for this. In the past, he took Mirapex for restless legs which was helpful. He no longer takes it. He ran out of the prescription a year ago or  so. He has no family history of OSA. He had a tonsillectomy as a child. He has restless leg symptoms, sometimes in the middle of the night he has difficulty going back to sleep in his legs hurt and sometimes he has a crawling sensation in his legs. His wife endorses that he twitches his legs and his sleep. His snoring can be loud. He has had pauses in his breathing per wife's report. He denies morning headaches.  His Past Medical History Is Significant For: Past Medical History:  Diagnosis Date  . Anxiety   . Atypical chest pain   . Dyspepsia   . Dysplastic nevus   . GERD (gastroesophageal reflux disease)   . Hyperglycemia   . Hyperlipidemia   . Lactose intolerance   .  Obesity   . OSA (obstructive sleep apnea)    uses Cpap  . Right shoulder pain   . Somnolence     His Past Surgical History Is Significant For: Past Surgical History:  Procedure Laterality Date  . ANTERIOR CERVICAL DECOMP/DISCECTOMY FUSION N/A 04/18/2016   Procedure: ANTERIOR CERVICAL DECOMPRESSION FUSION CERVICAL 5-6, CERVICAL 6-7 WITH INSTRUMENTATION AND ALLOGRAFT;  Surgeon: Phylliss Bob, MD;  Location: [place];  Service: Orthopedics;  Laterality: N/A;  ANTERIOR CERVICAL DECOMPRESSION FUSION CERVICAL 5-6, CERVICAL 6-7 WITH INSTRUMENTATION AND ALLOGRAFT  . COLONOSCOPY    . TONSILLECTOMY AND ADENOIDECTOMY      His Family History Is Significant For: Family History  Problem Relation Age of Onset  . Breast cancer Mother   . Benign prostatic hyperplasia Father   . Anxiety disorder Sister   . Asthma Brother     His Social History Is Significant For: Social History   Socioeconomic History  . Marital status: Married    Spouse name: Not on file  . Number of children: 2  . Years of education: BA  . Highest education level: Not on file  Occupational History  . Occupation: [employer]  Tobacco Use  . Smoking status: Never Smoker  . Smokeless tobacco: Never Used  Substance and Sexual Activity  . Alcohol  use: Yes    Alcohol/week: 0.0 standard drinks    Comment: social on weekends  . Drug use: No  . Sexual activity: Not on file  Other Topics Concern  . Not on file  Social History Narrative   Drinks 1 cup of coffee a day    Social Determinants of Health   Financial Resource Strain: Not on file  Food Insecurity: Not on file  Transportation Needs: Not on file  Physical Activity: Not on file  Stress: Not on file  Social Connections: Not on file    His Allergies Are:  Allergies  Allergen Reactions  . Fish Allergy Other (See Comments)  . Poultry Meal Other (See Comments)  :   His Current Medications Are:  Outpatient Encounter Medications as of 10/12/2020  Medication Sig  . BELSOMRA 10 MG TABS Take by mouth.  Marland Kitchen buPROPion (WELLBUTRIN XL) 300 MG 24 hr tablet Take 300 mg by mouth at bedtime.  . fexofenadine (ALLEGRA) 180 MG tablet Take 180 mg by mouth daily.  Marland Kitchen LORazepam (ATIVAN) 1 MG tablet Take 1 mg by mouth daily as needed.  Marland Kitchen losartan (COZAAR) 50 MG tablet Take 50 mg by mouth daily.  Marland Kitchen omeprazole (PRILOSEC) 20 MG capsule Take 20 mg by mouth daily.   No facility-administered encounter medications on file as of 10/12/2020.  :  Review of Systems:  Out of a complete 14 point review of systems, all are reviewed and negative with the exception of these symptoms as listed below:  Review of Systems  Neurological:       Rm 1 CPAP FU, doing better with mchine  Epworth Sleepiness Scale 0= would never doze 1= slight chance of dozing 2= moderate chance of dozing 3= high chance of dozing  Sitting and reading:1 Watching TV:1 Sitting inactive in a public place (ex. Theater or meeting): 0 As a passenger in a car for an hour without a break:1 Lying down to rest in the afternoon:2 Sitting and talking to someone: 0 Sitting quietly after lunch (no alcohol): 1 In a car, while stopped in traffic:0 Total:6    Objective:  Neurological Exam  Physical Exam Physical Examination:    Vitals:  10/12/20 1132  BP: 136/81  Pulse: 90   General Examination: The patient is a very pleasant 54 y.o. male in no acute distress. He appears well-developed and well-nourished and well groomed.   HEENT:Normocephalic, atraumatic, pupils are equal, round and reactive to light. Extraocular tracking is good. Hearing is grossly intact. Face is symmetric with normal facial animation and normal facial sensation. Speech is clear with no dysarthria noted. There is no hypophonia. There is no lip, neck/head, jaw or voice tremor. Neck is supple with full range of passive and active motion. There are no carotid bruits on auscultation. Oropharynx exam reveals: moderate mouth dryness, adequate dental hygiene and moderate airway crowding.  Tongue protrudes centrally and palate elevates symmetrically.  Chest:Clear to auscultation without wheezing, rhonchi or crackles noted.  Heart:S1+S2+0, regular and normal without murmurs, rubs or gallops noted.   Abdomen:Soft, non-tender and non-distended.  Extremities:There is no pitting edema in the distal lower extremities bilaterally.  Skin: Warm and dry without trophic changes noted. There are no varicose veins.  Musculoskeletal: exam reveals no obvious joint deformities, tenderness or joint swelling or erythema.   Neurologically:  Mental status: The patient is awake, alert and oriented in all 4 spheres. His immediate and remote memory, attention, language skills and fund of knowledge are appropriate. There is no evidence of aphasia, agnosia, apraxia or anomia. Speech is clear with normal prosody and enunciation. Thought process is linear. Mood is normal and affect is normal.  Cranial nerves II - XII are as described above under HEENT exam.  Motor exam: Normal bulk, strength and tone is noted. There is no tremor. Fine motor skills and coordination: grossly intact.  Cerebellar testing: No dysmetria or intention tremor. There is no truncal or gait  ataxia.  Sensory exam: intact to light touch in the upper and lower extremities.  Gait, station and balance: He stands easily. No veering to one side is noted. No leaning to one side is noted. Posture is age-appropriate and stance is narrow based. Gait shows normal stride length and normal pace. No problems turning are noted.    Assessment and Plan:  In summary, Antiono Ettinger is a very pleasant 54 year old male with an underlying medical history of anxiety, hyperlipidemia, allergic rhinitis, atypical chest pain, Crohn's disease, eosinophilic esophagitis, restless leg syndrome, and obesity, who presents for  follow-up consultation of his obstructive sleep apnea.  She was diagnosed with severe OSA with a split-night sleep study in November 2016.   He has been on CPAP therapy of 14 cm but has in the past struggled with it.  He has also noted benefit from treatment.  He has established with a new DME company, has received new supplies in the right type of house with his machine and a new type of interface, namely nasal pillows.  He is using his CPAP very well.  His apnea scores are low.  His usage is consistent.  He is commended for his treatment adherence.  He is encouraged to try the humidifier with the machine as he does have mouth dryness.  He is advised to clean his mask and keep his machine clean with regular changes in his supplies as instructed.  He has registered his machine which has been affected by the recall.  He has not used an ozone based cleaner or has his machine exposed to high heat.  He is willing to continue with his current machine until he can get a replacement from the recall.  He is advised to  follow-up routinely to see one of our nurse practitioners for sleep apnea checkup and call us with any interim questions or concerns. I answered all his questions today and he was in agreement with the plan.  I spent 20 minutes in total face-to-face time and in reviewing records during  pre-charting, more than 50% of which was spent in counseling and coordination of care, reviewing test results, reviewing medications and treatment regimen and/or in discussing or reviewing the diagnosis of OSA, the prognosis and treatment options. Pertinent laboratory and imaging test results that were available during this visit with the patient were reviewed by me and considered in my medical decision making (see chart for details).

## 2020-10-12 NOTE — Patient Instructions (Signed)
Please continue using your CPAP regularly. While your insurance requires that you use CPAP at least 4 hours each night on 70% of the nights, I recommend, that you not skip any nights and use it throughout the night if you can. Getting used to CPAP and staying with the treatment long term does take time and patience and discipline. Untreated obstructive sleep apnea when it is moderate to severe can have an adverse impact on cardiovascular health and raise her risk for heart disease, arrhythmias, hypertension, congestive heart failure, stroke and diabetes. Untreated obstructive sleep apnea causes sleep disruption, nonrestorative sleep, and sleep deprivation. This can have an impact on your day to day functioning and cause daytime sleepiness and impairment of cognitive function, memory loss, mood disturbance, and problems focussing. Using CPAP regularly can improve these symptoms.  You are fully compliant with your CPAP, your numbers look great. Keep up the good work! We can see you in 1 year, you can see one of our nurse practitioners as you are stable.

## 2020-10-12 NOTE — Progress Notes (Signed)
other

## 2021-09-13 ENCOUNTER — Telehealth: Payer: Self-pay | Admitting: Adult Health

## 2021-09-13 NOTE — Telephone Encounter (Signed)
LVM of new appointment date and time due to [facility] taking off the week after Christmas. Asked patient to call back if this appointment does not work for him.

## 2021-10-11 ENCOUNTER — Ambulatory Visit: Payer: BC Managed Care – PPO | Admitting: Adult Health

## 2021-10-18 ENCOUNTER — Ambulatory Visit: Payer: BC Managed Care – PPO | Admitting: Adult Health

## 2021-11-20 ENCOUNTER — Ambulatory Visit: Payer: Self-pay | Admitting: Adult Health

## 2022-04-12 ENCOUNTER — Other Ambulatory Visit: Payer: Self-pay | Admitting: Internal Medicine

## 2022-04-12 DIAGNOSIS — E785 Hyperlipidemia, unspecified: Secondary | ICD-10-CM

## 2022-06-07 ENCOUNTER — Ambulatory Visit
Admission: RE | Admit: 2022-06-07 | Discharge: 2022-06-07 | Disposition: A | Discharge status: Home / Self Care | Payer: No Typology Code available for payment source | Source: Ambulatory Visit | Attending: Internal Medicine | Admitting: Internal Medicine

## 2022-06-07 DIAGNOSIS — E785 Hyperlipidemia, unspecified: Secondary | ICD-10-CM
